# Patient Record
Sex: Female | Born: 1940
Health system: Southern US, Community
[De-identification: ages and names within clinical notes are randomized; demographics above are authoritative.]

## PROBLEM LIST (undated history)

## (undated) DIAGNOSIS — I10 Essential (primary) hypertension: Secondary | ICD-10-CM

## (undated) DIAGNOSIS — C4491 Basal cell carcinoma of skin, unspecified: Secondary | ICD-10-CM

## (undated) DIAGNOSIS — E785 Hyperlipidemia, unspecified: Secondary | ICD-10-CM

## (undated) DIAGNOSIS — F5101 Primary insomnia: Secondary | ICD-10-CM

## (undated) DIAGNOSIS — I4819 Other persistent atrial fibrillation: Secondary | ICD-10-CM

## (undated) DIAGNOSIS — F339 Major depressive disorder, recurrent, unspecified: Secondary | ICD-10-CM

## (undated) DIAGNOSIS — F1994 Other psychoactive substance use, unspecified with psychoactive substance-induced mood disorder: Secondary | ICD-10-CM

## (undated) DIAGNOSIS — I119 Hypertensive heart disease without heart failure: Secondary | ICD-10-CM

## (undated) DIAGNOSIS — F1020 Alcohol dependence, uncomplicated: Secondary | ICD-10-CM

## (undated) DIAGNOSIS — I4891 Unspecified atrial fibrillation: Secondary | ICD-10-CM

## (undated) DIAGNOSIS — I248 Other forms of acute ischemic heart disease: Secondary | ICD-10-CM

## (undated) DIAGNOSIS — D0510 Intraductal carcinoma in situ of unspecified breast: Secondary | ICD-10-CM

## (undated) HISTORY — DX: Hypertensive heart disease without heart failure: I11.9

## (undated) HISTORY — DX: Major depressive disorder, recurrent, unspecified: F33.9

## (undated) HISTORY — DX: Basal cell carcinoma of skin, unspecified: C44.91

## (undated) HISTORY — DX: Hyperlipidemia, unspecified: E78.5

## (undated) HISTORY — PX: MASTECTOMY: SHX3

## (undated) HISTORY — DX: Primary insomnia: F51.01

## (undated) HISTORY — PX: TONSILLECTOMY: SUR1361

## (undated) HISTORY — DX: Unspecified atrial fibrillation: I48.91

## (undated) HISTORY — DX: Other psychoactive substance use, unspecified with psychoactive substance-induced mood disorder: F19.94

## (undated) HISTORY — DX: Intraductal carcinoma in situ of unspecified breast: D05.10

## (undated) HISTORY — PX: APPENDECTOMY: SHX54

## (undated) HISTORY — DX: Other forms of acute ischemic heart disease: I24.8

## (undated) HISTORY — DX: Essential (primary) hypertension: I10

## (undated) HISTORY — PX: BREAST ENHANCEMENT SURGERY: SHX7

## (undated) HISTORY — DX: Other persistent atrial fibrillation: I48.19

## (undated) HISTORY — DX: Alcohol dependence, uncomplicated: F10.20

---

## 2015-09-13 DIAGNOSIS — E785 Hyperlipidemia, unspecified: Secondary | ICD-10-CM | POA: Insufficient documentation

## 2015-09-13 DIAGNOSIS — Z79899 Other long term (current) drug therapy: Secondary | ICD-10-CM | POA: Insufficient documentation

## 2015-09-13 DIAGNOSIS — F419 Anxiety disorder, unspecified: Secondary | ICD-10-CM

## 2015-09-13 DIAGNOSIS — G250 Essential tremor: Secondary | ICD-10-CM

## 2015-09-13 DIAGNOSIS — F339 Major depressive disorder, recurrent, unspecified: Secondary | ICD-10-CM | POA: Insufficient documentation

## 2015-09-13 DIAGNOSIS — I10 Essential (primary) hypertension: Secondary | ICD-10-CM | POA: Insufficient documentation

## 2015-09-13 DIAGNOSIS — R5381 Other malaise: Secondary | ICD-10-CM

## 2015-09-13 DIAGNOSIS — M1990 Unspecified osteoarthritis, unspecified site: Secondary | ICD-10-CM

## 2015-09-13 DIAGNOSIS — F5101 Primary insomnia: Secondary | ICD-10-CM

## 2015-09-13 DIAGNOSIS — R159 Full incontinence of feces: Secondary | ICD-10-CM

## 2015-09-13 HISTORY — DX: Other malaise: R53.81

## 2015-09-13 HISTORY — DX: Anxiety disorder, unspecified: F41.9

## 2015-09-13 HISTORY — DX: Primary insomnia: F51.01

## 2015-09-13 HISTORY — DX: Other long term (current) drug therapy: Z79.899

## 2015-09-13 HISTORY — DX: Major depressive disorder, recurrent, unspecified: F33.9

## 2015-09-13 HISTORY — DX: Hyperlipidemia, unspecified: E78.5

## 2015-09-13 HISTORY — DX: Essential (primary) hypertension: I10

## 2015-09-13 HISTORY — DX: Unspecified osteoarthritis, unspecified site: M19.90

## 2015-09-13 HISTORY — DX: Essential tremor: G25.0

## 2015-09-13 HISTORY — DX: Full incontinence of feces: R15.9

## 2015-11-20 DIAGNOSIS — E559 Vitamin D deficiency, unspecified: Secondary | ICD-10-CM

## 2015-11-20 HISTORY — DX: Vitamin D deficiency, unspecified: E55.9

## 2016-05-29 DIAGNOSIS — I10 Essential (primary) hypertension: Secondary | ICD-10-CM | POA: Diagnosis not present

## 2016-05-29 DIAGNOSIS — R45851 Suicidal ideations: Secondary | ICD-10-CM

## 2016-05-29 DIAGNOSIS — F10929 Alcohol use, unspecified with intoxication, unspecified: Secondary | ICD-10-CM

## 2016-05-29 DIAGNOSIS — F10239 Alcohol dependence with withdrawal, unspecified: Secondary | ICD-10-CM

## 2016-05-29 DIAGNOSIS — R748 Abnormal levels of other serum enzymes: Secondary | ICD-10-CM

## 2016-05-29 DIAGNOSIS — F329 Major depressive disorder, single episode, unspecified: Secondary | ICD-10-CM

## 2016-05-29 DIAGNOSIS — E785 Hyperlipidemia, unspecified: Secondary | ICD-10-CM

## 2016-05-30 DIAGNOSIS — I10 Essential (primary) hypertension: Secondary | ICD-10-CM | POA: Diagnosis not present

## 2016-05-30 DIAGNOSIS — F10929 Alcohol use, unspecified with intoxication, unspecified: Secondary | ICD-10-CM | POA: Diagnosis not present

## 2016-05-30 DIAGNOSIS — F10239 Alcohol dependence with withdrawal, unspecified: Secondary | ICD-10-CM | POA: Diagnosis not present

## 2016-05-30 DIAGNOSIS — R45851 Suicidal ideations: Secondary | ICD-10-CM | POA: Diagnosis not present

## 2016-05-31 DIAGNOSIS — R45851 Suicidal ideations: Secondary | ICD-10-CM | POA: Diagnosis not present

## 2016-05-31 DIAGNOSIS — F10929 Alcohol use, unspecified with intoxication, unspecified: Secondary | ICD-10-CM | POA: Diagnosis not present

## 2016-05-31 DIAGNOSIS — F10239 Alcohol dependence with withdrawal, unspecified: Secondary | ICD-10-CM | POA: Diagnosis not present

## 2016-05-31 DIAGNOSIS — I10 Essential (primary) hypertension: Secondary | ICD-10-CM | POA: Diagnosis not present

## 2016-06-01 DIAGNOSIS — F10239 Alcohol dependence with withdrawal, unspecified: Secondary | ICD-10-CM | POA: Diagnosis not present

## 2016-06-01 DIAGNOSIS — R45851 Suicidal ideations: Secondary | ICD-10-CM | POA: Diagnosis not present

## 2016-06-01 DIAGNOSIS — I10 Essential (primary) hypertension: Secondary | ICD-10-CM | POA: Diagnosis not present

## 2016-06-01 DIAGNOSIS — F10929 Alcohol use, unspecified with intoxication, unspecified: Secondary | ICD-10-CM | POA: Diagnosis not present

## 2016-06-02 DIAGNOSIS — F10239 Alcohol dependence with withdrawal, unspecified: Secondary | ICD-10-CM | POA: Diagnosis not present

## 2016-06-02 DIAGNOSIS — F10929 Alcohol use, unspecified with intoxication, unspecified: Secondary | ICD-10-CM | POA: Diagnosis not present

## 2016-06-02 DIAGNOSIS — I10 Essential (primary) hypertension: Secondary | ICD-10-CM | POA: Diagnosis not present

## 2016-06-02 DIAGNOSIS — R45851 Suicidal ideations: Secondary | ICD-10-CM | POA: Diagnosis not present

## 2016-06-11 DIAGNOSIS — F1020 Alcohol dependence, uncomplicated: Secondary | ICD-10-CM

## 2016-06-11 HISTORY — DX: Alcohol dependence, uncomplicated: F10.20

## 2016-07-25 DIAGNOSIS — F1994 Other psychoactive substance use, unspecified with psychoactive substance-induced mood disorder: Secondary | ICD-10-CM

## 2016-07-25 HISTORY — DX: Other psychoactive substance use, unspecified with psychoactive substance-induced mood disorder: F19.94

## 2016-08-10 DIAGNOSIS — F17200 Nicotine dependence, unspecified, uncomplicated: Secondary | ICD-10-CM | POA: Diagnosis not present

## 2016-08-10 DIAGNOSIS — R748 Abnormal levels of other serum enzymes: Secondary | ICD-10-CM

## 2016-08-10 DIAGNOSIS — J189 Pneumonia, unspecified organism: Secondary | ICD-10-CM

## 2016-08-10 DIAGNOSIS — F10239 Alcohol dependence with withdrawal, unspecified: Secondary | ICD-10-CM

## 2016-08-10 DIAGNOSIS — I10 Essential (primary) hypertension: Secondary | ICD-10-CM

## 2016-08-10 DIAGNOSIS — E871 Hypo-osmolality and hyponatremia: Secondary | ICD-10-CM

## 2016-08-10 DIAGNOSIS — F329 Major depressive disorder, single episode, unspecified: Secondary | ICD-10-CM

## 2016-08-11 DIAGNOSIS — R748 Abnormal levels of other serum enzymes: Secondary | ICD-10-CM | POA: Diagnosis not present

## 2016-08-11 DIAGNOSIS — R296 Repeated falls: Secondary | ICD-10-CM

## 2016-08-11 DIAGNOSIS — F10239 Alcohol dependence with withdrawal, unspecified: Secondary | ICD-10-CM | POA: Diagnosis not present

## 2016-08-11 DIAGNOSIS — E871 Hypo-osmolality and hyponatremia: Secondary | ICD-10-CM | POA: Diagnosis not present

## 2016-08-11 DIAGNOSIS — F17200 Nicotine dependence, unspecified, uncomplicated: Secondary | ICD-10-CM | POA: Diagnosis not present

## 2016-08-12 DIAGNOSIS — E871 Hypo-osmolality and hyponatremia: Secondary | ICD-10-CM | POA: Diagnosis not present

## 2016-08-12 DIAGNOSIS — F10239 Alcohol dependence with withdrawal, unspecified: Secondary | ICD-10-CM | POA: Diagnosis not present

## 2016-08-12 DIAGNOSIS — F17200 Nicotine dependence, unspecified, uncomplicated: Secondary | ICD-10-CM | POA: Diagnosis not present

## 2016-08-12 DIAGNOSIS — R748 Abnormal levels of other serum enzymes: Secondary | ICD-10-CM | POA: Diagnosis not present

## 2016-08-13 DIAGNOSIS — F17200 Nicotine dependence, unspecified, uncomplicated: Secondary | ICD-10-CM | POA: Diagnosis not present

## 2016-08-13 DIAGNOSIS — F10239 Alcohol dependence with withdrawal, unspecified: Secondary | ICD-10-CM | POA: Diagnosis not present

## 2016-08-13 DIAGNOSIS — R748 Abnormal levels of other serum enzymes: Secondary | ICD-10-CM | POA: Diagnosis not present

## 2016-08-13 DIAGNOSIS — E871 Hypo-osmolality and hyponatremia: Secondary | ICD-10-CM | POA: Diagnosis not present

## 2016-08-14 DIAGNOSIS — R748 Abnormal levels of other serum enzymes: Secondary | ICD-10-CM

## 2016-08-14 DIAGNOSIS — F10239 Alcohol dependence with withdrawal, unspecified: Secondary | ICD-10-CM

## 2016-08-14 DIAGNOSIS — I10 Essential (primary) hypertension: Secondary | ICD-10-CM

## 2016-08-14 DIAGNOSIS — E871 Hypo-osmolality and hyponatremia: Secondary | ICD-10-CM

## 2016-08-14 DIAGNOSIS — J189 Pneumonia, unspecified organism: Secondary | ICD-10-CM

## 2016-08-14 DIAGNOSIS — F329 Major depressive disorder, single episode, unspecified: Secondary | ICD-10-CM

## 2016-08-14 DIAGNOSIS — F17200 Nicotine dependence, unspecified, uncomplicated: Secondary | ICD-10-CM | POA: Diagnosis not present

## 2016-08-14 DIAGNOSIS — R296 Repeated falls: Secondary | ICD-10-CM

## 2016-08-15 DIAGNOSIS — E871 Hypo-osmolality and hyponatremia: Secondary | ICD-10-CM | POA: Diagnosis not present

## 2016-08-15 DIAGNOSIS — R748 Abnormal levels of other serum enzymes: Secondary | ICD-10-CM | POA: Diagnosis not present

## 2016-08-15 DIAGNOSIS — F17200 Nicotine dependence, unspecified, uncomplicated: Secondary | ICD-10-CM | POA: Diagnosis not present

## 2016-08-15 DIAGNOSIS — F10239 Alcohol dependence with withdrawal, unspecified: Secondary | ICD-10-CM | POA: Diagnosis not present

## 2016-08-16 DIAGNOSIS — E871 Hypo-osmolality and hyponatremia: Secondary | ICD-10-CM | POA: Diagnosis not present

## 2016-08-16 DIAGNOSIS — F10239 Alcohol dependence with withdrawal, unspecified: Secondary | ICD-10-CM | POA: Diagnosis not present

## 2016-08-16 DIAGNOSIS — R748 Abnormal levels of other serum enzymes: Secondary | ICD-10-CM | POA: Diagnosis not present

## 2016-08-16 DIAGNOSIS — F17200 Nicotine dependence, unspecified, uncomplicated: Secondary | ICD-10-CM | POA: Diagnosis not present

## 2016-10-20 DIAGNOSIS — I4819 Other persistent atrial fibrillation: Secondary | ICD-10-CM

## 2016-10-20 DIAGNOSIS — I119 Hypertensive heart disease without heart failure: Secondary | ICD-10-CM | POA: Insufficient documentation

## 2016-10-20 DIAGNOSIS — I248 Other forms of acute ischemic heart disease: Secondary | ICD-10-CM

## 2016-10-20 HISTORY — DX: Hypertensive heart disease without heart failure: I11.9

## 2016-10-20 HISTORY — DX: Other forms of acute ischemic heart disease: I24.8

## 2016-10-20 HISTORY — DX: Other persistent atrial fibrillation: I48.19

## 2018-05-21 NOTE — Progress Notes (Signed)
Cardiology Office Note:    Date:  05/22/2018   ID:  Rebekah Hunter, DOB Jan 21, 1941, MRN [id number]  PCP:  Lowella Dandy, NP  Cardiologist:  Shirlee More, MD    Referring MD: No ref. provider found    ASSESSMENT:    1. Chest pain in adult   2. Paroxysmal atrial fibrillation (HCC)   3. Hypertensive heart disease without heart failure   4. Alcoholism ([place])    PLAN:    In order of problems listed above:  1. Her symptoms are best described as atypical angina and I am quite concerned with a background history of previous demand ischemia.  Reviewed options for further evaluation either myocardial perfusion imaging or cardiac CTA she prefers not to leave town and she will be set up for cardiac CTA as an outpatient continue her current medications and not alarmed her with nitroglycerin that she can use in the future at the onset of symptoms.  If her myocardial perfusion study is normal I will ask her to have a CTA of her thoracic aorta although clinically she did not appear to have an acute aortic injury.  I will ask her to repeat a CBC with unexplained leukocytosis and recheck her d-dimer level today. 2. Stable maintain sinus rhythm 3. Continue her ARB 4. Stable   Next appointment: 4 weeks   Medication Adjustments/Labs and Tests Ordered: Current medicines are reviewed at length with the patient today.  Concerns regarding medicines are outlined above.  Orders Placed This Encounter  Procedures  . D-Dimer, Quantitative  . CBC  . MYOCARDIAL PERFUSION IMAGING  . EKG 12-Lead   Meds ordered this encounter  Medications  . nitroGLYCERIN (NITROSTAT) 0.4 MG SL tablet    Sig: Place 1 tablet (0.4 mg total) under the tongue every 5 (five) minutes as needed for chest pain.    Dispense:  25 tablet    Refill:  11    No chief complaint on file.   History of Present Illness:    SUELLYN Hunter is a 77 y.o. female with a hx of atrial fibrillation hypertension and demand ischemia in the  context of pneumonia and alcohol withdrawal syndrome last seen 01/23/17.  She is not anticoagulated her decision Compliance with diet, lifestyle and medications: Yes  [name] is seen today in follow-up to [facility] ED visit 05/07/2018 she presented to the hospital after several hours of chest pain at home told me she spent a total of 5-1/2 hours in the emergency room and unfortunately is quite displeased with her experience.  The onset of her chest pain was at rest she described as substernal tightness radiated through the chest both sides and up into her neck.  It was severe in nature but no diaphoresis nausea vomiting not pleuritic in nature and she was not short of breath.  She sat at home for hours hoping it would improve it did not when she finally got to the hospital she was given nitroglycerin with no relief and was also given Dilaudid which did not help her.  At the time she was discharged the symptoms had dissipated.  Her evaluation included 2 EKGs with minor nonspecific ST changes to normal troponins chest x-ray of atelectasis or opacity elevated white count 17,500 d-dimer was elevated but age-adjusted normal.  She did not have a CTA performed.  She was offered admission to the hospital declined and seeks my attention today.  She has had no recurrence.  She has not done well recently  she is undergoing repeated oral surgery for implants that have failed and feels under a great deal of personal and financial stress.  She has had no recurrent chest pain.  She has a background history of paroxysmal atrial fibrillation and had an elevated troponin felt to be due to demand ischemia in the setting of pneumonia and alcohol withdrawal syndrome.  She has had no palpitation syncope or TIA.  Past Medical History:  Diagnosis Date  . Alcoholism ([place]) 06/11/2016  . Demand ischemia ([place]) 10/20/2016  . Essential hypertension 09/13/2015   Last Assessment & Plan:  This is stable for her at this time and will  follow along  . Hyperlipidemia, unspecified 09/13/2015   Last Assessment & Plan:  Update her lipids for her fasting  . Hypertensive heart disease without heart failure 10/20/2016  . Persistent atrial fibrillation ([place]) 10/20/2016   No anticoag from fall risk.  CHADS2 vasc=4  . Primary insomnia 09/13/2015   Last Assessment & Plan:  She feels this is stable for her at this time and will follow  . Recurrent major depressive disorder ([place]) 09/13/2015   Last Assessment & Plan:  Psychiatrist has said we can follow her now and she is comfortable with this as well on her remeron and her ativan and will be taking this over in about month for refills for her she will let us know when she needs this  . Substance induced mood disorder ([place]) 07/25/2016    Past Surgical History:  Procedure Laterality Date  . APPENDECTOMY    . BREAST ENHANCEMENT SURGERY    . MASTECTOMY    . TONSILLECTOMY      Current Medications: Current Meds  Medication Sig  . acetaminophen (TYLENOL) 500 MG tablet Take 500 mg by mouth every 6 (six) hours as needed.  . busPIRone (BUSPAR) 5 MG tablet TAKE 1 TABLET BY MOUTH THREE TIMES A DAY AS NEEDED FOR ANXIETY  . cetirizine (ZYRTEC) 10 MG tablet Take 10 mg by mouth daily as needed for allergies.  . Cholecalciferol (VITAMIN D3) 3000 units TABS Take 1 tablet by mouth daily.  Marland Kitchen losartan (COZAAR) 50 MG tablet Take 1 tablet by mouth daily.  . mirtazapine (REMERON) 15 MG tablet Take 1 tablet by mouth daily.  Marland Kitchen omeprazole (PRILOSEC) 20 MG capsule Take 1 capsule by mouth daily.  . pravastatin (PRAVACHOL) 40 MG tablet Take 1 tablet by mouth daily.     Allergies:   Iodine; Aspirin; Metoprolol; Diltiazem; and Latex   Social History   Socioeconomic History  . Marital status: Divorced    Spouse name: Not on file  . Number of children: Not on file  . Years of education: Not on file  . Highest education level: Not on file  Occupational History  . Not on file  Social Needs  . Financial  resource strain: Not on file  . Food insecurity:    Worry: Not on file    Inability: Not on file  . Transportation needs:    Medical: Not on file    Non-medical: Not on file  Tobacco Use  . Smoking status: Current Every Day Smoker  . Smokeless tobacco: Never Used  Substance and Sexual Activity  . Alcohol use: Yes  . Drug use: Not Currently  . Sexual activity: Not on file  Lifestyle  . Physical activity:    Days per week: Not on file    Minutes per session: Not on file  . Stress: Not on file  Relationships  .  Social connections:    Talks on phone: Not on file    Gets together: Not on file    Attends religious service: Not on file    Active member of club or organization: Not on file    Attends meetings of clubs or organizations: Not on file    Relationship status: Not on file  Other Topics Concern  . Not on file  Social History Narrative  . Not on file     Family History: The patient's family history includes Cancer in her daughter, mother, and sister; Diabetes in her mother; Stroke in her father. ROS:   Please see the history of present illness.    All other systems reviewed and are negative.  EKGs/Labs/Other Studies Reviewed:    The following studies were reviewed today:  EKG:  EKG ordered today.  The ekg ordered today demonstrates sinus rhythm normal EKG  Recent Labs: Records reviewed [facility] CBC was normal except white count 17,500 d-dimer 775 normal age-adjusted to normal troponins BMP was normal proBNP was low No results found for requested labs within last 8760 hours.  Recent Lipid Panel No results found for: CHOL, TRIG, HDL, CHOLHDL, VLDL, LDLCALC, LDLDIRECT  Physical Exam:    VS:  BP (!) 156/84 (BP Location: Right Arm, Patient Position: Sitting, Cuff Size: Normal)   Pulse 75   Ht 5\' 5"  (1.651 m)   Wt 133 lb (60.3 kg)   SpO2 97%   BMI 22.13 kg/m     Wt Readings from Last 3 Encounters:  05/22/18 133 lb (60.3 kg)     GEN:  Well nourished,  well developed in no acute distress HEENT: Normal NECK: No JVD; No carotid bruits LYMPHATICS: No lymphadenopathy CARDIAC: RRR, no murmurs, rubs, gallops RESPIRATORY:  Clear to auscultation without rales, wheezing or rhonchi  ABDOMEN: Soft, non-tender, non-distended MUSCULOSKELETAL:  No edema; No deformity  SKIN: Warm and dry NEUROLOGIC:  Alert and oriented x 3 PSYCHIATRIC:  Normal affect    Signed, Shirlee More, MD  05/22/2018 11:58 AM    [place]

## 2018-05-22 ENCOUNTER — Encounter: Payer: Self-pay | Admitting: Cardiology

## 2018-05-22 ENCOUNTER — Ambulatory Visit (INDEPENDENT_AMBULATORY_CARE_PROVIDER_SITE_OTHER): Payer: Medicare Other | Admitting: Cardiology

## 2018-05-22 ENCOUNTER — Telehealth: Payer: Self-pay | Admitting: Cardiology

## 2018-05-22 VITALS — BP 156/84 | HR 75 | Ht 65.0 in | Wt 133.0 lb

## 2018-05-22 DIAGNOSIS — R079 Chest pain, unspecified: Secondary | ICD-10-CM

## 2018-05-22 DIAGNOSIS — I480 Paroxysmal atrial fibrillation: Secondary | ICD-10-CM

## 2018-05-22 DIAGNOSIS — F1020 Alcohol dependence, uncomplicated: Secondary | ICD-10-CM | POA: Diagnosis not present

## 2018-05-22 DIAGNOSIS — I119 Hypertensive heart disease without heart failure: Secondary | ICD-10-CM | POA: Diagnosis not present

## 2018-05-22 MED ORDER — NITROGLYCERIN 0.4 MG SL SUBL: 0.4000 mg | SUBLINGUAL_TABLET | SUBLINGUAL | 11 refills | Status: AC | PRN

## 2018-05-22 NOTE — Telephone Encounter (Signed)
Says she was supposed to have labs today but didn't(states no one told her to go to the lab)

## 2018-05-22 NOTE — Telephone Encounter (Signed)
Patient reports not stopping at the lab today after visit for lab work. Patient will come back on Monday 05/25/18 to have labs drawn.

## 2018-05-22 NOTE — Patient Instructions (Signed)
Medication Instructions:  Your physician has recommended you make the following change in your medication:   START: nitroglycerin 0.4mg  sublingual (under the tongue) as needed for chest pain  When having chest pain, stop what you are doing and sit down. Take 1 nitro, wait 5 minutes. Still having chest pain, take 1 nitro, wait 5 minutes. Still having chest pain, take 1 nitro, dial 911. Total of 3 nitro in 15 minutes.    Labwork: You will have lab work done today: D Dimer, CBC  Testing/Procedures: Your physician has requested that you have a lexiscan myoview. For further information please visit [email or web address]. Please follow instruction sheet, as given.    Follow-Up: Your physician recommends that you schedule a follow-up appointment in: 4 weeks    Nitroglycerin sublingual tablets What is this medicine? NITROGLYCERIN (nye troe GLI ser in) is a type of vasodilator. It relaxes blood vessels, increasing the blood and oxygen supply to your heart. This medicine is used to relieve chest pain caused by angina. It is also used to prevent chest pain before activities like climbing stairs, going outdoors in cold weather, or sexual activity. This medicine may be used for other purposes; ask your health care provider or pharmacist if you have questions. COMMON BRAND NAME(S): Nitroquick, Nitrostat, Nitrotab What should I tell my health care provider before I take this medicine? They need to know if you have any of these conditions: -anemia -head injury, recent stroke, or bleeding in the brain -liver disease -previous heart attack -an unusual or allergic reaction to nitroglycerin, other medicines, foods, dyes, or preservatives -pregnant or trying to get pregnant -breast-feeding How should I use this medicine? Take this medicine by mouth as needed. At the first sign of an angina attack (chest pain or tightness) place one tablet under your tongue. You can also take this medicine 5 to 10  minutes before an event likely to produce chest pain. Follow the directions on the prescription label. Let the tablet dissolve under the tongue. Do not swallow whole. Replace the dose if you accidentally swallow it. It will help if your mouth is not dry. Saliva around the tablet will help it to dissolve more quickly. Do not eat or drink, smoke or chew tobacco while a tablet is dissolving. If you are not better within 5 minutes after taking ONE dose of nitroglycerin, call 9-1-1 immediately to seek emergency medical care. Do not take more than 3 nitroglycerin tablets over 15 minutes. If you take this medicine often to relieve symptoms of angina, your doctor or health care professional may provide you with different instructions to manage your symptoms. If symptoms do not go away after following these instructions, it is important to call 9-1-1 immediately. Do not take more than 3 nitroglycerin tablets over 15 minutes. Talk to your pediatrician regarding the use of this medicine in children. Special care may be needed. Overdosage: If you think you have taken too much of this medicine contact a poison control center or emergency room at once. NOTE: This medicine is only for you. Do not share this medicine with others. What if I miss a dose? This does not apply. This medicine is only used as needed. What may interact with this medicine? Do not take this medicine with any of the following medications: -certain migraine medicines like ergotamine and dihydroergotamine (DHE) -medicines used to treat erectile dysfunction like sildenafil, tadalafil, and vardenafil -riociguat This medicine may also interact with the following medications: -alteplase -aspirin -heparin -medicines for high blood pressure -  medicines for mental depression -other medicines used to treat angina -phenothiazines like chlorpromazine, mesoridazine, prochlorperazine, thioridazine This list may not describe all possible interactions.  Give your health care provider a list of all the medicines, herbs, non-prescription drugs, or dietary supplements you use. Also tell them if you smoke, drink alcohol, or use illegal drugs. Some items may interact with your medicine. What should I watch for while using this medicine? Tell your doctor or health care professional if you feel your medicine is no longer working. Keep this medicine with you at all times. Sit or lie down when you take your medicine to prevent falling if you feel dizzy or faint after using it. Try to remain calm. This will help you to feel better faster. If you feel dizzy, take several deep breaths and lie down with your feet propped up, or bend forward with your head resting between your knees. You may get drowsy or dizzy. Do not drive, use machinery, or do anything that needs mental alertness until you know how this drug affects you. Do not stand or sit up quickly, especially if you are an older patient. This reduces the risk of dizzy or fainting spells. Alcohol can make you more drowsy and dizzy. Avoid alcoholic drinks. Do not treat yourself for coughs, colds, or pain while you are taking this medicine without asking your doctor or health care professional for advice. Some ingredients may increase your blood pressure. What side effects may I notice from receiving this medicine? Side effects that you should report to your doctor or health care professional as soon as possible: -blurred vision -dry mouth -skin rash -sweating -the feeling of extreme pressure in the head -unusually weak or tired Side effects that usually do not require medical attention (report to your doctor or health care professional if they continue or are bothersome): -flushing of the face or neck -headache -irregular heartbeat, palpitations -nausea, vomiting This list may not describe all possible side effects. Call your doctor for medical advice about side effects. You may report side effects to FDA  at 1-800-FDA-1088. Where should I keep my medicine? Keep out of the reach of children. Store at room temperature between 20 and 25 degrees C (68 and 77 degrees F). Store in [facility] [employer]. Protect from light and moisture. Keep tightly closed. Throw away any unused medicine after the expiration date. NOTE: This sheet is a summary. It may not cover all possible information. If you have questions about this medicine, talk to your doctor, pharmacist, or health care provider.  2018 Elsevier/Gold Standard (2013-07-01 17:57:36)    Any Other Special Instructions Will Be Listed Below (If Applicable).     If you need a refill on your cardiac medications before your next appointment, please call your pharmacy.

## 2018-05-26 LAB — CBC
HEMOGLOBIN: 13.6 g/dL (ref 11.1–15.9)
Hematocrit: 40.5 % (ref 34.0–46.6)
MCH: 28.6 pg (ref 26.6–33.0)
MCHC: 33.6 g/dL (ref 31.5–35.7)
MCV: 85 fL (ref 79–97)
Platelets: 239 10*3/uL (ref 150–450)
RBC: 4.76 x10E6/uL (ref 3.77–5.28)
RDW: 13.6 % (ref 12.3–15.4)
WBC: 8.3 10*3/uL (ref 3.4–10.8)

## 2018-05-26 LAB — D-DIMER, QUANTITATIVE (NOT AT ARMC): D-DIMER: 0.67 mg{FEU}/L — AB (ref 0.00–0.49)

## 2018-06-03 ENCOUNTER — Telehealth (HOSPITAL_COMMUNITY): Payer: Self-pay | Admitting: *Deleted

## 2018-06-03 NOTE — Telephone Encounter (Signed)
Patient given detailed instructions per Myocardial Perfusion Study Information Sheet for the test on 06/09/18. Patient notified to arrive 15 minutes early and that it is imperative to arrive on time for appointment to keep from having the test rescheduled.  If you need to cancel or reschedule your appointment, please call the office within 24 hours of your appointment. . Patient verbalized understanding. Kirstie Peri

## 2018-06-09 ENCOUNTER — Ambulatory Visit: Payer: Medicare Other | Admitting: Cardiology

## 2018-06-09 ENCOUNTER — Ambulatory Visit (INDEPENDENT_AMBULATORY_CARE_PROVIDER_SITE_OTHER): Payer: Medicare Other

## 2018-06-09 VITALS — Ht 65.0 in | Wt 133.0 lb

## 2018-06-09 DIAGNOSIS — R51 Headache: Secondary | ICD-10-CM

## 2018-06-09 DIAGNOSIS — I119 Hypertensive heart disease without heart failure: Secondary | ICD-10-CM

## 2018-06-09 DIAGNOSIS — R110 Nausea: Secondary | ICD-10-CM | POA: Diagnosis not present

## 2018-06-09 DIAGNOSIS — I480 Paroxysmal atrial fibrillation: Secondary | ICD-10-CM | POA: Diagnosis not present

## 2018-06-09 DIAGNOSIS — I4819 Other persistent atrial fibrillation: Secondary | ICD-10-CM

## 2018-06-09 DIAGNOSIS — R079 Chest pain, unspecified: Secondary | ICD-10-CM | POA: Diagnosis not present

## 2018-06-09 DIAGNOSIS — F1020 Alcohol dependence, uncomplicated: Secondary | ICD-10-CM

## 2018-06-09 DIAGNOSIS — I481 Persistent atrial fibrillation: Secondary | ICD-10-CM

## 2018-06-09 LAB — MYOCARDIAL PERFUSION IMAGING
CHL CUP NUCLEAR SRS: 0
CHL CUP RESTING HR STRESS: 64 {beats}/min
CSEPPHR: 99 {beats}/min
LV dias vol: 43 mL (ref 46–106)
LVSYSVOL: 18 mL
SDS: 7
SSS: 7
TID: 0.94

## 2018-06-09 MED ORDER — REGADENOSON 0.4 MG/5ML IV SOLN
0.4000 mg | Freq: Once | INTRAVENOUS | Status: AC
  Administered 2018-06-09: 0.4 mg via INTRAVENOUS

## 2018-06-09 MED ORDER — TECHNETIUM TC 99M TETROFOSMIN IV KIT
31.6000 | PACK | Freq: Once | INTRAVENOUS | Status: AC | PRN
  Administered 2018-06-09: 31.6 via INTRAVENOUS

## 2018-06-09 MED ORDER — TECHNETIUM TC 99M TETROFOSMIN IV KIT
10.2000 | PACK | Freq: Once | INTRAVENOUS | Status: AC | PRN
  Administered 2018-06-09: 10.2 via INTRAVENOUS

## 2018-06-09 MED ORDER — AMINOPHYLLINE 25 MG/ML IV SOLN
75.0000 mg | Freq: Once | INTRAVENOUS | Status: AC
  Administered 2018-06-09: 75 mg via INTRAVENOUS

## 2018-07-09 ENCOUNTER — Ambulatory Visit (INDEPENDENT_AMBULATORY_CARE_PROVIDER_SITE_OTHER): Payer: Medicare Other | Admitting: Cardiology

## 2018-07-09 ENCOUNTER — Encounter: Payer: Self-pay | Admitting: Cardiology

## 2018-07-09 VITALS — BP 158/74 | HR 76 | Ht 65.0 in | Wt 133.8 lb

## 2018-07-09 DIAGNOSIS — R079 Chest pain, unspecified: Secondary | ICD-10-CM

## 2018-07-09 DIAGNOSIS — I119 Hypertensive heart disease without heart failure: Secondary | ICD-10-CM | POA: Diagnosis not present

## 2018-07-09 DIAGNOSIS — I4819 Other persistent atrial fibrillation: Secondary | ICD-10-CM | POA: Diagnosis not present

## 2018-07-09 NOTE — Patient Instructions (Signed)

## 2018-07-09 NOTE — Progress Notes (Signed)
Cardiology Office Note:    Date:  07/09/2018   ID:  [name], DOB 02/07/1941, MRN [id number]  PCP:  Lowella Dandy, NP  Cardiologist:  Shirlee More, MD    Referring MD: Lowella Dandy, NP    ASSESSMENT:    1. Persistent atrial fibrillation   2. Hypertensive heart disease without heart failure   3. Chest pain in adult    PLAN:    In order of problems listed above:  1. Stable she is asymptomatic rate is controlled and again defers antiplatelet or anticoagulation therapy 2. Stable continue ARB 3. Stable she has had typical angina normal myocardial perfusion study no recurrence and she will take nitroglycerin as needed.   Next appointment: 6 months   Medication Adjustments/Labs and Tests Ordered: Current medicines are reviewed at length with the patient today.  Concerns regarding medicines are outlined above.  No orders of the defined types were placed in this encounter.  No orders of the defined types were placed in this encounter.   Chief Complaint  Patient presents with  . Follow-up    after testing    History of Present Illness:    Rebekah Hunter is a 77 y.o. female with a hx of atrial fibrillation hypertension and demand ischemia in the context of pneumonia and alcohol withdrawal syndrome  last seen 05/22/18 after [facility] admission for chest pain. A MPI done after was normal.  Study Highlights   The left ventricular ejection fraction is normal (55-65%).  Nuclear stress EF: 59%.  There was no ST segment deviation noted during stress.  The study is normal.  This is a low risk study   Compliance with diet, lifestyle and medications: Yes  She has had no recurrent chest pain pleased with the quality of her life unaware of atrial fibrillation without palpitation edema shortness of breath syncope or TIA she is aspirin allergic I offered clopidogrel declined and does not want anticoagulation Past Medical History:  Diagnosis Date  . Alcoholism ([place])  06/11/2016  . Demand ischemia ([place]) 10/20/2016  . Essential hypertension 09/13/2015   Last Assessment & Plan:  This is stable for her at this time and will follow along  . Hyperlipidemia, unspecified 09/13/2015   Last Assessment & Plan:  Update her lipids for her fasting  . Hypertensive heart disease without heart failure 10/20/2016  . Persistent atrial fibrillation 10/20/2016   No anticoag from fall risk.  CHADS2 vasc=4  . Primary insomnia 09/13/2015   Last Assessment & Plan:  She feels this is stable for her at this time and will follow  . Recurrent major depressive disorder ([place]) 09/13/2015   Last Assessment & Plan:  Psychiatrist has said we can follow her now and she is comfortable with this as well on her remeron and her ativan and will be taking this over in about month for refills for her she will let us know when she needs this  . Substance induced mood disorder ([place]) 07/25/2016    Past Surgical History:  Procedure Laterality Date  . APPENDECTOMY    . BREAST ENHANCEMENT SURGERY    . MASTECTOMY    . TONSILLECTOMY      Current Medications: Current Meds  Medication Sig  . acetaminophen (TYLENOL) 500 MG tablet Take 500 mg by mouth every 6 (six) hours as needed.  . busPIRone (BUSPAR) 5 MG tablet TAKE 1 TABLET BY MOUTH THREE TIMES A DAY AS NEEDED FOR ANXIETY  . cetirizine (ZYRTEC) 10 MG tablet Take  10 mg by mouth daily as needed for allergies.  . Cholecalciferol (VITAMIN D3) 3000 units TABS Take 1 tablet by mouth daily.  Marland Kitchen losartan (COZAAR) 50 MG tablet Take 1 tablet by mouth daily.  . mirtazapine (REMERON) 15 MG tablet Take 1 tablet by mouth daily.  . nitroGLYCERIN (NITROSTAT) 0.4 MG SL tablet Place 1 tablet (0.4 mg total) under the tongue every 5 (five) minutes as needed for chest pain.  Marland Kitchen omeprazole (PRILOSEC) 20 MG capsule Take 1 capsule by mouth daily.  . pravastatin (PRAVACHOL) 40 MG tablet Take 1 tablet by mouth daily.     Allergies:   Iodine; Aspirin; Clonidine derivatives;  Metoprolol; Diltiazem; and Latex   Social History   Socioeconomic History  . Marital status: Divorced    Spouse name: Not on file  . Number of children: Not on file  . Years of education: Not on file  . Highest education level: Not on file  Occupational History  . Not on file  Social Needs  . Financial resource strain: Not on file  . Food insecurity:    Worry: Not on file    Inability: Not on file  . Transportation needs:    Medical: Not on file    Non-medical: Not on file  Tobacco Use  . Smoking status: Current Every Day Smoker  . Smokeless tobacco: Never Used  Substance and Sexual Activity  . Alcohol use: Yes  . Drug use: Not Currently  . Sexual activity: Not on file  Lifestyle  . Physical activity:    Days per week: Not on file    Minutes per session: Not on file  . Stress: Not on file  Relationships  . Social connections:    Talks on phone: Not on file    Gets together: Not on file    Attends religious service: Not on file    Active member of club or organization: Not on file    Attends meetings of clubs or organizations: Not on file    Relationship status: Not on file  Other Topics Concern  . Not on file  Social History Narrative  . Not on file     Family History: The patient's family history includes Cancer in her daughter, mother, and sister; Diabetes in her mother; Stroke in her father. ROS:   Please see the history of present illness.    All other systems reviewed and are negative.  EKGs/Labs/Other Studies Reviewed:    The following studies were reviewed today:  Recent Labs: 05/25/2018: Hemoglobin 13.6; Platelets 239  Recent Lipid Panel No results found for: CHOL, TRIG, HDL, CHOLHDL, VLDL, LDLCALC, LDLDIRECT  Physical Exam:    VS:  BP (!) 158/74 (BP Location: Right Arm, Patient Position: Sitting, Cuff Size: Normal)   Pulse 76   Ht 5\' 5"  (1.651 m)   Wt 133 lb 12.8 oz (60.7 kg)   SpO2 95%   BMI 22.27 kg/m     Wt Readings from Last 3  Encounters:  07/09/18 133 lb 12.8 oz (60.7 kg)  06/09/18 133 lb (60.3 kg)  05/22/18 133 lb (60.3 kg)     GEN:  Well nourished, well developed in no acute distress HEENT: Normal NECK: No JVD; No carotid bruits LYMPHATICS: No lymphadenopathy CARDIAC: Irr Irr variable S1  RESPIRATORY:  Clear to auscultation without rales, wheezing or rhonchi  ABDOMEN: Soft, non-tender, non-distended MUSCULOSKELETAL:  No edema; No deformity  SKIN: Warm and dry NEUROLOGIC:  Alert and oriented x 3 PSYCHIATRIC:  Normal affect  Signed, Shirlee More, MD  07/09/2018 10:33 AM    [place]

## 2019-01-05 ENCOUNTER — Encounter: Payer: Self-pay | Admitting: Cardiology

## 2019-01-05 ENCOUNTER — Telehealth: Payer: Self-pay | Admitting: Cardiology

## 2019-01-05 ENCOUNTER — Telehealth (INDEPENDENT_AMBULATORY_CARE_PROVIDER_SITE_OTHER): Payer: Medicare Other | Admitting: Cardiology

## 2019-01-05 ENCOUNTER — Other Ambulatory Visit: Payer: Self-pay

## 2019-01-05 VITALS — BP 132/68 | HR 86 | Ht 65.0 in | Wt 124.0 lb

## 2019-01-05 DIAGNOSIS — I248 Other forms of acute ischemic heart disease: Secondary | ICD-10-CM

## 2019-01-05 DIAGNOSIS — I2489 Other forms of acute ischemic heart disease: Secondary | ICD-10-CM

## 2019-01-05 DIAGNOSIS — E782 Mixed hyperlipidemia: Secondary | ICD-10-CM

## 2019-01-05 DIAGNOSIS — I4819 Other persistent atrial fibrillation: Secondary | ICD-10-CM | POA: Diagnosis not present

## 2019-01-05 DIAGNOSIS — I119 Hypertensive heart disease without heart failure: Secondary | ICD-10-CM

## 2019-01-05 NOTE — Patient Instructions (Addendum)
Medication Instructions:  Your physician recommends that you continue on your current medications as directed. Please refer to the Current Medication list given to you today.  If you need a refill on your cardiac medications before your next appointment, please call your pharmacy.   Lab work: None  If you have labs (blood work) drawn today and your tests are completely normal, you will receive your results only by: Marland Kitchen MyChart Message (if you have MyChart) OR . A paper copy in the mail If you have any lab test that is abnormal or we need to change your treatment, we will call you to review the results.  Testing/Procedures: None  Follow-Up: At [facility], you and your health needs are our priority.  As part of our continuing mission to provide you with exceptional heart care, we have created designated Provider Care Teams.  These Care Teams include your primary Cardiologist (physician) and Advanced Practice Providers (APPs -  Physician Assistants and Nurse Practitioners) who all work together to provide you with the care you need, when you need it. You will need a follow up appointment in 6 months: Monday, 07/05/2019, at 11:00 am in [place] office.

## 2019-01-05 NOTE — Progress Notes (Signed)
Virtual Visit via Telephone Note   She does not have access for video conference  This visit type was conducted due to national recommendations for restrictions regarding the COVID-19 Pandemic (e.g. social distancing) in an effort to limit this patient's exposure and mitigate transmission in our community.  Due to her co-morbid illnesses, this patient is at least at moderate risk for complications without adequate follow up.  This format is felt to be most appropriate for this patient at this time.  The patient did not have access to video technology/had technical difficulties with video requiring transitioning to audio format only (telephone).  All issues noted in this document were discussed and addressed.  No physical exam could be performed with this format.  Please refer to the patient's chart for her  consent to telehealth for [facility].   Evaluation Performed:  Follow-up visit  Date:  01/05/2019   ID:  Rebekah Hunter, DOB [birth year], MRN [id number]  Patient Location: Home Provider Location: Home  PCP:  Lowella Dandy, NP  Cardiologist:  No primary care provider on file. Dr Bettina Gavia Electrophysiologist:  None   Chief Complaint:  Atrial fibrillation  History of Present Illness:    Rebekah Hunter is a 78 y.o. female with a hx of atrial fibrillation hypertension and demand ischemia in the context of pneumonia and alcohol withdrawal syndrome  last seen 07/09/18.  Anticoagulation was recommended and the patient made a decision not to accept.  She is practicing social isolation social distancing wears a mask outdoors gloves and good handwashing and sensation  She has had no palpitation edema shortness of breath chest pain or syncope.  The patient does not have symptoms concerning for COVID-19 infection (fever, chills, cough, or new shortness of breath).    Past Medical History:  Diagnosis Date  . Alcoholism ([place]) 06/11/2016  . Demand ischemia ([place]) 10/20/2016  . Essential  hypertension 09/13/2015   Last Assessment & Plan:  This is stable for her at this time and will follow along  . Hyperlipidemia, unspecified 09/13/2015   Last Assessment & Plan:  Update her lipids for her fasting  . Hypertensive heart disease without heart failure 10/20/2016  . Persistent atrial fibrillation 10/20/2016   No anticoag from fall risk.  CHADS2 vasc=4  . Primary insomnia 09/13/2015   Last Assessment & Plan:  She feels this is stable for her at this time and will follow  . Recurrent major depressive disorder ([place]) 09/13/2015   Last Assessment & Plan:  Psychiatrist has said we can follow her now and she is comfortable with this as well on her remeron and her ativan and will be taking this over in about month for refills for her she will let us know when she needs this  . Substance induced mood disorder ([place]) 07/25/2016   Past Surgical History:  Procedure Laterality Date  . APPENDECTOMY    . BREAST ENHANCEMENT SURGERY    . MASTECTOMY    . TONSILLECTOMY       Current Meds  Medication Sig  . acetaminophen (TYLENOL) 500 MG tablet Take 500 mg by mouth every 6 (six) hours as needed.  . busPIRone (BUSPAR) 5 MG tablet TAKE 1 TABLET BY MOUTH THREE TIMES A DAY AS NEEDED FOR ANXIETY  . cetirizine (ZYRTEC) 10 MG tablet Take 10 mg by mouth daily as needed for allergies.  . Cholecalciferol (VITAMIN D3) 3000 units TABS Take 1 tablet by mouth daily.  . diphenhydrAMINE (BENADRYL) 25 MG tablet Take 25  mg by mouth every 6 (six) hours as needed for allergies.  Marland Kitchen losartan (COZAAR) 50 MG tablet Take 1 tablet by mouth daily.  . mirtazapine (REMERON) 15 MG tablet Take 1 tablet by mouth daily.  . nitroGLYCERIN (NITROSTAT) 0.4 MG SL tablet Place 1 tablet (0.4 mg total) under the tongue every 5 (five) minutes as needed for chest pain.  Marland Kitchen omeprazole (PRILOSEC) 20 MG capsule Take 1 capsule by mouth daily as needed.   . pravastatin (PRAVACHOL) 40 MG tablet Take 1 tablet by mouth daily.     Allergies:    Iodine; Aspirin; Clonidine derivatives; Metoprolol; Diltiazem; and Latex   Social History   Tobacco Use  . Smoking status: Current Some Day Smoker    Types: Cigarettes  . Smokeless tobacco: Never Used  Substance Use Topics  . Alcohol use: Yes  . Drug use: Not Currently     Family Hx: The patient's family history includes Cancer in her daughter, mother, and sister; Diabetes in her mother; Stroke in her father.  ROS:   Please see the history of present illness.     All other systems reviewed and are negative.   Prior CV studies:   The following studies were reviewed today:  Study Highlights 06/09/18 Myoview  The left ventricular ejection fraction is normal (55-65%).  Nuclear stress EF: 59%.  There was no ST segment deviation noted during stress.  The study is normal.  This is a low risk study   Labs/Other Tests and Data Reviewed:    EKG:  No ECG reviewed.  Recent Labs:  08/24/18: CMP normal creatinine 0.76 LDL 111 HDL 56 Hemoglobin 14.0 platelets 206,000 05/25/2018: Hemoglobin 13.6; Platelets 239   Recent Lipid Panel No results found for: CHOL, TRIG, HDL, CHOLHDL, LDLCALC, LDLDIRECT  Wt Readings from Last 3 Encounters:  01/05/19 124 lb (56.2 kg)  07/09/18 133 lb 12.8 oz (60.7 kg)  06/09/18 133 lb (60.3 kg)     Objective:    Vital Signs:  BP 132/68 (BP Location: Right Arm, Patient Position: Sitting)   Pulse 86   Ht 5\' 5"  (1.651 m)   Wt 124 lb (56.2 kg)   BMI 20.63 kg/m    VITAL SIGNS:  reviewed PSYCH:  normal affect   ASSESSMENT & PLAN:    1. Atrial fibrillation, persistent rate controlled asymptomatic without suppressant medications and she has chosen not to be anticoagulated. 2. Hypertension stable continue her ARB BP at target continue to monitor at home 3. Demand ischemia no recurrence no evidence of significant CAD and myocardial perfusion study reviewed September 2019.  I do not think she requires a repeat ischemia evaluation at this time 4.  Hyperlipidemia stable continue her statin  COVID-19 Education: The signs and symptoms of COVID-19 were discussed with the patient and how to seek care for testing (follow up with PCP or arrange E-visit).  The importance of social distancing was discussed today.  Time:   Today, I have spent 22 minutes with the patient with telehealth technology discussing the above problems.     Medication Adjustments/Labs and Tests Ordered: Current medicines are reviewed at length with the patient today.  Concerns regarding medicines are outlined above.   Tests Ordered: No orders of the defined types were placed in this encounter.   Medication Changes: No orders of the defined types were placed in this encounter.   Disposition:  Follow up in 6 month(s)  Signed, Shirlee More, MD  01/05/2019 11:38 AM    [facility] 

## 2019-01-05 NOTE — Telephone Encounter (Signed)
Virtual Visit Pre-Appointment Phone Call  Steps For Call:  1. Confirm consent - "In the setting of the current Covid19 crisis, you are scheduled for a (phone or video) visit with your provider on (date) at (time).  Just as we do with many in-office visits, in order for you to participate in this visit, we must obtain consent.  If you'd like, I can send this to your mychart (if signed up) or email for you to review.  Otherwise, I can obtain your verbal consent now.  All virtual visits are billed to your insurance company just like a normal visit would be.  By agreeing to a virtual visit, we'd like you to understand that the technology does not allow for your provider to perform an examination, and thus may limit your provider's ability to fully assess your condition. If your provider identifies any concerns that need to be evaluated in person, we will make arrangements to do so.  Finally, though the technology is pretty good, we cannot assure that it will always work on either your or our end, and in the setting of a video visit, we may have to convert it to a phone-only visit.  In either situation, we cannot ensure that we have a secure connection.  Are you willing to proceed?" STAFF: Did the patient verbally acknowledge consent to telehealth visit? Document YES/NO here: yes  2. Confirm the BEST phone number to call the day of the visit by including in appointment notes  3. Give patient instructions for MyChart download to smartphone OR [email or web address] as below if video visit (depending on what platform provider is using)  4. Confirm that appointment type is correct in Epic appointment notes (VIDEO vs PHONE)  5. Advise patient to be prepared with their blood pressure, heart rate, weight, any heart rhythm information, their current medicines, and a piece of paper and pen handy for any instructions they may receive the day of their visit  6. Inform patient they will receive a phone call 15 minutes  prior to their appointment time (may be from unknown caller ID) so they should be prepared to answer    TELEPHONE CALL NOTE  Rebekah Hunter has been deemed a candidate for a follow-up tele-health visit to limit community exposure during the Covid-19 pandemic. I spoke with the patient via phone to ensure availability of phone/video source, confirm preferred email & phone number, and discuss instructions and expectations.  I reminded Rebekah Chris Sheen to be prepared with any vital sign and/or heart rhythm information that could potentially be obtained via home monitoring, at the time of her visit. I reminded [name] to expect a phone call prior to her visit.  Calla Kicks 01/05/2019 9:30 AM   INSTRUCTIONS FOR DOWNLOADING THE MYCHART APP TO SMARTPHONE  - The patient must first make sure to have activated MyChart and know their login information - If Apple, go to [employer] and type in MyChart in the search bar and download the app. If Android, ask patient to go to [employer] and type in [place] in the search bar and download the app. The app is free but as with any other app downloads, their phone may require them to verify saved payment information or Apple/Android password.  - The patient will need to then log into the app with their MyChart username and password, and select  as their healthcare provider to link the account. When it is time for your visit,  go to the MyChart app, find appointments, and click Begin Video Visit. Be sure to Select Allow for your device to access the Microphone and Camera for your visit. You will then be connected, and your provider will be with you shortly.  **If they have any issues connecting, or need assistance please contact MyChart service desk [phone])**  **If using a computer, in order to ensure the best quality for their visit they will need to use either of the following Internet Browsers: [employer],  or Google Chrome**  IF USING DOXIMITY or DOXY.ME - The patient will receive a link just prior to their visit by text.     FULL LENGTH CONSENT FOR TELE-HEALTH VISIT   I hereby voluntarily request, consent and authorize [facility] and its employed or contracted physicians, physician assistants, nurse practitioners or other licensed health care professionals (the Practitioner), to provide me with telemedicine health care services (the Services") as deemed necessary by the treating Practitioner. I acknowledge and consent to receive the Services by the Practitioner via telemedicine. I understand that the telemedicine visit will involve communicating with the Practitioner through live audiovisual communication technology and the disclosure of certain medical information by electronic transmission. I acknowledge that I have been given the opportunity to request an in-person assessment or other available alternative prior to the telemedicine visit and am voluntarily participating in the telemedicine visit.  I understand that I have the right to withhold or withdraw my consent to the use of telemedicine in the course of my care at any time, without affecting my right to future care or treatment, and that the Practitioner or I may terminate the telemedicine visit at any time. I understand that I have the right to inspect all information obtained and/or recorded in the course of the telemedicine visit and may receive copies of available information for a reasonable fee.  I understand that some of the potential risks of receiving the Services via telemedicine include:   Delay or interruption in medical evaluation due to technological equipment failure or disruption;  Information transmitted may not be sufficient (e.g. poor resolution of images) to allow for appropriate medical decision making by the Practitioner; and/or   In rare instances, security protocols could fail, causing a breach of personal health  information.  Furthermore, I acknowledge that it is my responsibility to provide information about my medical history, conditions and care that is complete and accurate to the best of my ability. I acknowledge that Practitioner's advice, recommendations, and/or decision may be based on factors not within their control, such as incomplete or inaccurate data provided by me or distortions of diagnostic images or specimens that may result from electronic transmissions. I understand that the practice of medicine is not an exact science and that Practitioner makes no warranties or guarantees regarding treatment outcomes. I acknowledge that I will receive a copy of this consent concurrently upon execution via email to the email address I last provided but may also request a printed copy by calling the office of [facility].    I understand that my insurance will be billed for this visit.   I have read or had this consent read to me.  I understand the contents of this consent, which adequately explains the benefits and risks of the Services being provided via telemedicine.   I have been provided ample opportunity to ask questions regarding this consent and the Services and have had my questions answered to my satisfaction.  I give my informed  consent for the services to be provided through the use of telemedicine in my medical care  By participating in this telemedicine visit I agree to the above.

## 2019-07-05 ENCOUNTER — Ambulatory Visit: Payer: Medicare Other | Admitting: Cardiology

## 2019-08-18 NOTE — Progress Notes (Signed)
Cardiology Office Note:    Date:  08/19/2019   ID:  Rebekah Hunter, DOB [birth year], MRN [id number]  PCP:  Rebekah Dandy, NP  Cardiologist:  Rebekah More, MD    Referring MD: Rebekah Dandy, NP    ASSESSMENT:    1. Paroxysmal atrial fibrillation (HCC)   2. Hypertensive heart disease without heart failure   3. Mixed hyperlipidemia    PLAN:    In order of problems listed above:  1. Stable no clinical recurrence if recurrent she will require an antiarrhythmic drug and anticoagulation which she has declined so far 2. Stable hypertension continue ARB no evidence of heart failure 3. Stable tolerates a low intensity statin lipids at target   Next appointment: 1 year or sooner if she has breakthrough episodes of angina with her previous demand ischemia or atrial fibrillation.  Note she has a prescription for nitroglycerin   Medication Adjustments/Labs and Tests Ordered: Current medicines are reviewed at length with the patient today.  Concerns regarding medicines are outlined above.  No orders of the defined types were placed in this encounter.  No orders of the defined types were placed in this encounter.   Chief Complaint  Patient presents with  . Atrial Fibrillation  . Follow-up    History of Present Illness:    Rebekah Hunter is a 78 y.o. female with a hx of  atrial fibrillation hypertension and demand ischemia in the context of pneumonia and alcohol withdrawal syndrome seen 05/22/18 after [facility] admission for chest pain. A MPI done after was normal.   Study Highlights   The left ventricular ejection fraction is normal (55-65%).  Nuclear stress EF: 59%.  There was no ST segment deviation noted during stress.  The study is normal.  This is a low risk study   She was last seen 07/09/2018. Compliance with diet, lifestyle and medications: Yes  She continues to struggle with depression but fortunately has had no chest pain recurrent atrial fibrillation.  No exercise  intolerance palpitation shortness of breath syncope or TIA.  Recent labs performed 04/26/2019 cholesterol 201 HDL 58 LDL 117 creatinine normal Past Medical History:  Diagnosis Date  . Alcoholism ([place]) 06/11/2016  . Demand ischemia ([place]) 10/20/2016  . Essential hypertension 09/13/2015   Last Assessment & Plan:  This is stable for her at this time and will follow along  . Hyperlipidemia, unspecified 09/13/2015   Last Assessment & Plan:  Update her lipids for her fasting  . Hypertensive heart disease without heart failure 10/20/2016  . Persistent atrial fibrillation ([place]) 10/20/2016   No anticoag from fall risk.  CHADS2 vasc=4  . Primary insomnia 09/13/2015   Last Assessment & Plan:  She feels this is stable for her at this time and will follow  . Recurrent major depressive disorder ([place]) 09/13/2015   Last Assessment & Plan:  Psychiatrist has said we can follow her now and she is comfortable with this as well on her remeron and her ativan and will be taking this over in about month for refills for her she will let us know when she needs this  . Substance induced mood disorder ([place]) 07/25/2016    Past Surgical History:  Procedure Laterality Date  . APPENDECTOMY    . BREAST ENHANCEMENT SURGERY    . MASTECTOMY    . TONSILLECTOMY      Current Medications: Current Meds  Medication Sig  . acetaminophen (TYLENOL) 500 MG tablet Take 500 mg by mouth every 6 (six)  hours as needed.  . busPIRone (BUSPAR) 5 MG tablet TAKE 1 TABLET BY MOUTH THREE TIMES A DAY AS NEEDED FOR ANXIETY  . cetirizine (ZYRTEC) 10 MG tablet Take 10 mg by mouth daily as needed for allergies.  . Cholecalciferol (VITAMIN D3) 3000 units TABS Take 1 tablet by mouth daily.  . diphenhydrAMINE (BENADRYL) 25 MG tablet Take 25 mg by mouth every 6 (six) hours as needed for allergies.  Marland Kitchen losartan (COZAAR) 50 MG tablet Take 1 tablet by mouth daily.  . mirtazapine (REMERON) 15 MG tablet Take 1 tablet by mouth daily.  . nitroGLYCERIN (NITROSTAT)  0.4 MG SL tablet Place 1 tablet (0.4 mg total) under the tongue every 5 (five) minutes as needed for chest pain.  Marland Kitchen omeprazole (PRILOSEC) 20 MG capsule Take 1 capsule by mouth daily as needed.   . pravastatin (PRAVACHOL) 40 MG tablet Take 1 tablet by mouth daily.  . TURMERIC PO Take 1 tablet by mouth daily.     Allergies:   Iodine, Aspirin, Clonidine derivatives, Metoprolol, Diltiazem, and Latex   Social History   Socioeconomic History  . Marital status: Divorced    Spouse name: Not on file  . Number of children: Not on file  . Years of education: Not on file  . Highest education level: Not on file  Occupational History  . Not on file  Social Needs  . Financial resource strain: Not on file  . Food insecurity    Worry: Not on file    Inability: Not on file  . Transportation needs    Medical: Not on file    Non-medical: Not on file  Tobacco Use  . Smoking status: Current Some Day Smoker    Types: Cigarettes  . Smokeless tobacco: Never Used  Substance and Sexual Activity  . Alcohol use: Not Currently  . Drug use: Not Currently  . Sexual activity: Not on file  Lifestyle  . Physical activity    Days per week: Not on file    Minutes per session: Not on file  . Stress: Not on file  Relationships  . Social Herbalist on phone: Not on file    Gets together: Not on file    Attends religious service: Not on file    Active member of club or organization: Not on file    Attends meetings of clubs or organizations: Not on file    Relationship status: Not on file  Other Topics Concern  . Not on file  Social History Narrative  . Not on file     Family History: The patient's family history includes Cancer in her daughter, mother, and sister; Diabetes in her mother; Stroke in her father. ROS:   Please see the history of present illness.    All other systems reviewed and are negative.  EKGs/Labs/Other Studies Reviewed:    The following studies were reviewed today:   EKG:  EKG ordered today and personally reviewed.  The ekg ordered today demonstrates sinus rhythm normal EKG    Physical Exam:    VS:  BP (!) 164/84 (BP Location: Right Arm, Patient Position: Sitting, Cuff Size: Normal)   Pulse 72   Ht 5\' 5"  (1.651 m)   Wt 128 lb 6.4 oz (58.2 kg)   SpO2 97%   BMI 21.37 kg/m     Wt Readings from Last 3 Encounters:  08/19/19 128 lb 6.4 oz (58.2 kg)  01/05/19 124 lb (56.2 kg)  07/09/18 133 lb 12.8 oz (  60.7 kg)     GEN: She looks much healthier no longer looks chronically ill and debilitated well nourished, well developed in no acute distress HEENT: Normal NECK: No JVD; No carotid bruits LYMPHATICS: No lymphadenopathy CARDIAC: RRR, no murmurs, rubs, gallops RESPIRATORY:  Clear to auscultation without rales, wheezing or rhonchi  ABDOMEN: Soft, non-tender, non-distended MUSCULOSKELETAL:  No edema; No deformity  SKIN: Warm and dry NEUROLOGIC:  Alert and oriented x 3 PSYCHIATRIC:  Normal affect    Signed, Rebekah More, MD  08/19/2019 11:18 AM    [place]

## 2019-08-19 ENCOUNTER — Encounter: Payer: Self-pay | Admitting: Cardiology

## 2019-08-19 ENCOUNTER — Other Ambulatory Visit: Payer: Self-pay

## 2019-08-19 ENCOUNTER — Ambulatory Visit (INDEPENDENT_AMBULATORY_CARE_PROVIDER_SITE_OTHER): Payer: Medicare Other | Admitting: Cardiology

## 2019-08-19 VITALS — BP 164/84 | HR 72 | Ht 65.0 in | Wt 128.4 lb

## 2019-08-19 DIAGNOSIS — I480 Paroxysmal atrial fibrillation: Secondary | ICD-10-CM

## 2019-08-19 DIAGNOSIS — I119 Hypertensive heart disease without heart failure: Secondary | ICD-10-CM | POA: Diagnosis not present

## 2019-08-19 DIAGNOSIS — E782 Mixed hyperlipidemia: Secondary | ICD-10-CM | POA: Diagnosis not present

## 2019-08-19 NOTE — Patient Instructions (Signed)
Medication Instructions:  Your physician recommends that you continue on your current medications as directed. Please refer to the Current Medication list given to you today.  *If you need a refill on your cardiac medications before your next appointment, please call your pharmacy*  Lab Work: None  If you have labs (blood work) drawn today and your tests are completely normal, you will receive your results only by: Marland Kitchen MyChart Message (if you have MyChart) OR . A paper copy in the mail If you have any lab test that is abnormal or we need to change your treatment, we will call you to review the results.  Testing/Procedures: You had an EKG today.   Follow-Up: At [facility], you and your health needs are our priority.  As part of our continuing mission to provide you with exceptional heart care, we have created designated Provider Care Teams.  These Care Teams include your primary Cardiologist (physician) and Advanced Practice Providers (APPs -  Physician Assistants and Nurse Practitioners) who all work together to provide you with the care you need, when you need it.  Your next appointment:   9 month(s)  The format for your next appointment:   In Person  Provider:   Shirlee More, MD

## 2019-12-03 DIAGNOSIS — M810 Age-related osteoporosis without current pathological fracture: Secondary | ICD-10-CM | POA: Diagnosis not present

## 2019-12-03 DIAGNOSIS — I10 Essential (primary) hypertension: Secondary | ICD-10-CM | POA: Diagnosis not present

## 2019-12-03 DIAGNOSIS — J449 Chronic obstructive pulmonary disease, unspecified: Secondary | ICD-10-CM | POA: Diagnosis not present

## 2019-12-03 DIAGNOSIS — I480 Paroxysmal atrial fibrillation: Secondary | ICD-10-CM | POA: Diagnosis not present

## 2019-12-03 DIAGNOSIS — E785 Hyperlipidemia, unspecified: Secondary | ICD-10-CM | POA: Diagnosis not present

## 2019-12-10 DIAGNOSIS — I10 Essential (primary) hypertension: Secondary | ICD-10-CM | POA: Diagnosis not present

## 2019-12-10 DIAGNOSIS — Z6820 Body mass index (BMI) 20.0-20.9, adult: Secondary | ICD-10-CM | POA: Diagnosis not present

## 2020-01-03 DIAGNOSIS — Z6820 Body mass index (BMI) 20.0-20.9, adult: Secondary | ICD-10-CM | POA: Diagnosis not present

## 2020-01-03 DIAGNOSIS — I10 Essential (primary) hypertension: Secondary | ICD-10-CM | POA: Diagnosis not present

## 2020-01-10 DIAGNOSIS — Z6820 Body mass index (BMI) 20.0-20.9, adult: Secondary | ICD-10-CM | POA: Diagnosis not present

## 2020-01-10 DIAGNOSIS — I10 Essential (primary) hypertension: Secondary | ICD-10-CM | POA: Diagnosis not present

## 2020-01-28 DIAGNOSIS — I10 Essential (primary) hypertension: Secondary | ICD-10-CM | POA: Diagnosis not present

## 2020-01-28 DIAGNOSIS — Z6820 Body mass index (BMI) 20.0-20.9, adult: Secondary | ICD-10-CM | POA: Diagnosis not present

## 2020-02-16 DIAGNOSIS — D225 Melanocytic nevi of trunk: Secondary | ICD-10-CM | POA: Diagnosis not present

## 2020-02-16 DIAGNOSIS — D2239 Melanocytic nevi of other parts of face: Secondary | ICD-10-CM | POA: Diagnosis not present

## 2020-02-16 DIAGNOSIS — D1801 Hemangioma of skin and subcutaneous tissue: Secondary | ICD-10-CM | POA: Diagnosis not present

## 2020-02-16 DIAGNOSIS — L578 Other skin changes due to chronic exposure to nonionizing radiation: Secondary | ICD-10-CM | POA: Diagnosis not present

## 2020-03-13 DIAGNOSIS — E785 Hyperlipidemia, unspecified: Secondary | ICD-10-CM | POA: Diagnosis not present

## 2020-03-13 DIAGNOSIS — R252 Cramp and spasm: Secondary | ICD-10-CM | POA: Diagnosis not present

## 2020-03-13 DIAGNOSIS — Z6820 Body mass index (BMI) 20.0-20.9, adult: Secondary | ICD-10-CM | POA: Diagnosis not present

## 2020-03-13 DIAGNOSIS — I10 Essential (primary) hypertension: Secondary | ICD-10-CM | POA: Diagnosis not present

## 2020-04-26 DIAGNOSIS — Z0000 Encounter for general adult medical examination without abnormal findings: Secondary | ICD-10-CM | POA: Diagnosis not present

## 2020-04-26 DIAGNOSIS — Z9181 History of falling: Secondary | ICD-10-CM | POA: Diagnosis not present

## 2020-04-26 DIAGNOSIS — E785 Hyperlipidemia, unspecified: Secondary | ICD-10-CM | POA: Diagnosis not present

## 2020-05-15 ENCOUNTER — Telehealth: Payer: Self-pay | Admitting: Cardiology

## 2020-05-15 NOTE — Telephone Encounter (Signed)
Rebekah Hunter is calling requesting her appointment scheduled for 05/17/20 be made virtual. Please advise.

## 2020-05-15 NOTE — Telephone Encounter (Signed)
Spoke to patient just now and she let me know that she has a way to take all of her vitals prior to her virtual visit and the visit was switched to virtual for her.

## 2020-05-17 ENCOUNTER — Telehealth (INDEPENDENT_AMBULATORY_CARE_PROVIDER_SITE_OTHER): Payer: Medicare Other | Admitting: Cardiology

## 2020-05-17 ENCOUNTER — Encounter: Payer: Self-pay | Admitting: Cardiology

## 2020-05-17 VITALS — BP 111/64 | HR 64 | Wt 120.0 lb

## 2020-05-17 DIAGNOSIS — I119 Hypertensive heart disease without heart failure: Secondary | ICD-10-CM | POA: Diagnosis not present

## 2020-05-17 DIAGNOSIS — I480 Paroxysmal atrial fibrillation: Secondary | ICD-10-CM

## 2020-05-17 DIAGNOSIS — Z20822 Contact with and (suspected) exposure to covid-19: Secondary | ICD-10-CM

## 2020-05-17 HISTORY — DX: Contact with and (suspected) exposure to covid-19: Z20.822

## 2020-05-17 NOTE — Patient Instructions (Signed)
Medication Instructions:  Your physician recommends that you continue on your current medications as directed. Please refer to the Current Medication list given to you today.  *If you need a refill on your cardiac medications before your next appointment, please call your pharmacy*   Lab Work: None If you have labs (blood work) drawn today and your tests are completely normal, you will receive your results only by:  [place] (if you have MyChart) OR  A paper copy in the mail If you have any lab test that is abnormal or we need to change your treatment, we will call you to review the results.   Testing/Procedures: None   Follow-Up: At [facility], you and your health needs are our priority.  As part of our continuing mission to provide you with exceptional heart care, we have created designated Provider Care Teams.  These Care Teams include your primary Cardiologist (physician) and Advanced Practice Providers (APPs -  Physician Assistants and Nurse Practitioners) who all work together to provide you with the care you need, when you need it.  We recommend signing up for the patient portal called "MyChart".  Sign up information is provided on this After Visit Summary.  MyChart is used to connect with patients for Virtual Visits (Telemedicine).  Patients are able to view lab/test results, encounter notes, upcoming appointments, etc.  Non-urgent messages can be sent to your provider as well.   To learn more about what you can do with MyChart, go to [email or web address].    Your next appointment:   Patient will call to schedule once COVID-19 has passed or if she feels that she needs an appointment sooner.   The format for your next appointment:   In Person  Provider:   Shirlee More, MD   Other Instructions

## 2020-05-17 NOTE — Progress Notes (Signed)
Virtual Visit via Telephone Note   This visit type was conducted due to national recommendations for restrictions regarding the COVID-19 Pandemic (e.g. social distancing) in an effort to limit this patient's exposure and mitigate transmission in our community.  Due to her co-morbid illnesses, this patient is at least at moderate risk for complications without adequate follow up.  This format is felt to be most appropriate for this patient at this time.  The patient did not have access to video technology/had technical difficulties with video requiring transitioning to audio format only (telephone).  All issues noted in this document were discussed and addressed.  No physical exam could be performed with this format.  Please refer to the patient's chart for her  consent to telehealth for [facility]. Date:  05/17/2020   ID:  Rebekah Hunter, DOB 11-14-1940, MRN [id number]  PCP:  Lowella Dandy, NP  Cardiologist:  Shirlee More, MD    Referring MD: Lowella Dandy, NP    ASSESSMENT:    1. Hypertensive heart disease without heart failure   2. Paroxysmal atrial fibrillation ([place])   3. Close exposure to COVID-19 virus    PLAN:    In order of problems listed above:  1. Stable she is presently taking combination amlodipine and losartan BP is at target and we discussed optimal ways to measure blood pressure not first in the morning after resting 5 to 10 minutes. 2. Stable no clinical recurrence 3. Hyperlipidemia is at target continue her low intensity statin 4. She had exposure Sunday to a vaccinated person with Covid she was wearing a mask they shared a pew at church she is self isolating for 14 days.  Time 15 minutes Next appointment: 6 months   Medication Adjustments/Labs and Tests Ordered: Current medicines are reviewed at length with the patient today.  Concerns regarding medicines are outlined above.  No orders of the defined types were placed in this encounter.  No orders of  the defined types were placed in this encounter.   Chief Complaint  Patient presents with  . Follow-up  . Atrial Fibrillation  . Hypertension    History of Present Illness:    Rebekah Hunter is a 79 y.o. female with a hx of   paroxysmal atrial fibrillation hypertension and demand ischemia in the context of pneumonia and alcohol withdrawal syndrome and  [facility] health admission for chest pain with a normal myocardial perfusion study September 2019. She was  last seen 08/18/2020. Compliance with diet, lifestyle and medications: yes  Phone visit because of exposure to Covid Sunday She had no video link or technology for home visit. She has an ambulatory blood pressure cuff to measure heart rate and blood pressure  No edema chest pain palpitation or syncope.  She gets variable blood pressure measurements but we talked about it and she will void first thing in the morning and be sure that she rests 5 to 10 minutes before checking her home blood pressure.  Those numbers are at target. Past Medical History:  Diagnosis Date  . Alcoholism ([place]) 06/11/2016  . Arthritis 09/13/2015   Last Assessment & Plan:  Formatting of this note might be different from the original. She feels this is stable for her and if she is more mobile then she feels better as a whole and she is trying to be more with this  . Demand ischemia ([place]) 10/20/2016  . Essential hypertension 09/13/2015   Last Assessment & Plan:  This is stable for her at this time and will follow along  . Hyperlipidemia, unspecified 09/13/2015   Last Assessment & Plan:  Update her lipids for her fasting  . Hypertensive heart disease without heart failure 10/20/2016  . Malaise and fatigue 09/13/2015   Last Assessment & Plan:  Formatting of this note might be different from the original. Update her labs for her she is getting better with getting out more and being active  . Persistent atrial fibrillation ([place]) 10/20/2016   No anticoag from fall  risk.  CHADS2 vasc=4  . Primary insomnia 09/13/2015   Last Assessment & Plan:  She feels this is stable for her at this time and will follow  . Recurrent major depressive disorder ([place]) 09/13/2015   Last Assessment & Plan:  Psychiatrist has said we can follow her now and she is comfortable with this as well on her remeron and her ativan and will be taking this over in about month for refills for her she will let us know when she needs this  . Substance induced mood disorder ([place]) 07/25/2016  . Vitamin D deficiency 11/20/2015   Last Assessment & Plan:  Formatting of this note might be different from the original. She takes this orally will follow    Past Surgical History:  Procedure Laterality Date  . APPENDECTOMY    . BREAST ENHANCEMENT SURGERY    . MASTECTOMY    . TONSILLECTOMY      Current Medications: Current Meds  Medication Sig  . acetaminophen (TYLENOL) 500 MG tablet Take 500 mg by mouth every 6 (six) hours as needed.  Marland Kitchen amLODipine (NORVASC) 5 MG tablet Take 5 mg by mouth daily.  . cetirizine (ZYRTEC) 10 MG tablet Take 10 mg by mouth daily.  . Cholecalciferol (VITAMIN D3) 3000 units TABS Take 1 tablet by mouth daily.  . diphenhydrAMINE (BENADRYL) 25 MG tablet Take 25 mg by mouth every 6 (six) hours as needed for allergies.  Marland Kitchen LORazepam (ATIVAN) 0.5 MG tablet Take 0.5 tablets by mouth every 12 (twelve) hours as needed.  Marland Kitchen losartan (COZAAR) 100 MG tablet Take 100 mg by mouth at bedtime.  . nitroGLYCERIN (NITROSTAT) 0.4 MG SL tablet Place 1 tablet (0.4 mg total) under the tongue every 5 (five) minutes as needed for chest pain.  . pravastatin (PRAVACHOL) 40 MG tablet Take 1 tablet by mouth daily.  . TURMERIC PO Take 1 tablet by mouth daily.  . [DISCONTINUED] losartan (COZAAR) 50 MG tablet Take 1 tablet by mouth daily.     Allergies:   Iodine, Aspirin, Clonidine derivatives, Metoprolol, Diltiazem, and Latex   Social History   Socioeconomic History  . Marital status: Divorced     Spouse name: Not on file  . Number of children: Not on file  . Years of education: Not on file  . Highest education level: Not on file  Occupational History  . Not on file  Tobacco Use  . Smoking status: Current Some Day Smoker    Types: Cigarettes  . Smokeless tobacco: Never Used  Vaping Use  . Vaping Use: Every day  Substance and Sexual Activity  . Alcohol use: Not Currently  . Drug use: Not Currently  . Sexual activity: Not on file  Other Topics Concern  . Not on file  Social History Narrative  . Not on file   Social Determinants of Health   Financial Resource Strain:   . Difficulty of Paying Living Expenses: Not on file  Food Insecurity:   .  Worried About Charity fundraiser in the Last Year: Not on file  . Ran Out of Food in the Last Year: Not on file  Transportation Needs:   . Lack of Transportation (Medical): Not on file  . Lack of Transportation (Non-Medical): Not on file  Physical Activity:   . Days of Exercise per Week: Not on file  . Minutes of Exercise per Session: Not on file  Stress:   . Feeling of Stress : Not on file  Social Connections:   . Frequency of Communication with Friends and Family: Not on file  . Frequency of Social Gatherings with Friends and Family: Not on file  . Attends Religious Services: Not on file  . Active Member of Clubs or Organizations: Not on file  . Attends Archivist Meetings: Not on file  . Marital Status: Not on file     Family History: The patient's family history includes Cancer in her daughter, mother, and sister; Diabetes in her mother; Stroke in her father. ROS:   Please see the history of present illness.    All other systems reviewed and are negative.  EKGs/Labs/Other Studies Reviewed:    The following studies were reviewed today:  Recent Labs: 03/13/2020: Cholesterol 189 LDL 104 HDL 67 triglycerides 104, lipids at target Creatinine normal 0.72  Physical Exam:    VS:  BP 111/64 Comment: Home @  12:00  Pulse 64   Wt 120 lb (54.4 kg)   SpO2 98%   BMI 19.97 kg/m     Wt Readings from Last 3 Encounters:  05/17/20 120 lb (54.4 kg)  08/19/19 128 lb 6.4 oz (58.2 kg)  01/05/19 124 lb (56.2 kg)   Vital signs reviewed mood affect normal no respiratory distress wheezing during conversation   Signed, Shirlee More, MD  05/17/2020 1:27 PM    [facility]

## 2020-06-01 DIAGNOSIS — H353131 Nonexudative age-related macular degeneration, bilateral, early dry stage: Secondary | ICD-10-CM | POA: Diagnosis not present

## 2020-06-01 DIAGNOSIS — Z961 Presence of intraocular lens: Secondary | ICD-10-CM | POA: Diagnosis not present

## 2020-06-15 DIAGNOSIS — E785 Hyperlipidemia, unspecified: Secondary | ICD-10-CM | POA: Diagnosis not present

## 2020-06-15 DIAGNOSIS — I10 Essential (primary) hypertension: Secondary | ICD-10-CM | POA: Diagnosis not present

## 2020-06-15 DIAGNOSIS — Z23 Encounter for immunization: Secondary | ICD-10-CM | POA: Diagnosis not present

## 2020-06-15 DIAGNOSIS — I480 Paroxysmal atrial fibrillation: Secondary | ICD-10-CM | POA: Diagnosis not present

## 2020-12-19 DIAGNOSIS — K219 Gastro-esophageal reflux disease without esophagitis: Secondary | ICD-10-CM | POA: Diagnosis not present

## 2020-12-19 DIAGNOSIS — J069 Acute upper respiratory infection, unspecified: Secondary | ICD-10-CM | POA: Diagnosis not present

## 2020-12-19 DIAGNOSIS — J309 Allergic rhinitis, unspecified: Secondary | ICD-10-CM | POA: Diagnosis not present

## 2021-02-13 DIAGNOSIS — J449 Chronic obstructive pulmonary disease, unspecified: Secondary | ICD-10-CM | POA: Diagnosis not present

## 2021-02-13 DIAGNOSIS — Z87898 Personal history of other specified conditions: Secondary | ICD-10-CM | POA: Diagnosis not present

## 2021-02-13 DIAGNOSIS — I480 Paroxysmal atrial fibrillation: Secondary | ICD-10-CM | POA: Diagnosis not present

## 2021-02-13 DIAGNOSIS — I10 Essential (primary) hypertension: Secondary | ICD-10-CM | POA: Diagnosis not present

## 2021-02-13 DIAGNOSIS — E785 Hyperlipidemia, unspecified: Secondary | ICD-10-CM | POA: Diagnosis not present

## 2021-02-13 DIAGNOSIS — Z681 Body mass index (BMI) 19 or less, adult: Secondary | ICD-10-CM | POA: Diagnosis not present

## 2021-02-19 DIAGNOSIS — D225 Melanocytic nevi of trunk: Secondary | ICD-10-CM | POA: Diagnosis not present

## 2021-02-19 DIAGNOSIS — D2239 Melanocytic nevi of other parts of face: Secondary | ICD-10-CM | POA: Diagnosis not present

## 2021-02-19 DIAGNOSIS — L814 Other melanin hyperpigmentation: Secondary | ICD-10-CM | POA: Diagnosis not present

## 2021-02-19 DIAGNOSIS — D1801 Hemangioma of skin and subcutaneous tissue: Secondary | ICD-10-CM | POA: Diagnosis not present

## 2021-02-19 DIAGNOSIS — L820 Inflamed seborrheic keratosis: Secondary | ICD-10-CM | POA: Diagnosis not present

## 2021-04-30 DIAGNOSIS — Z139 Encounter for screening, unspecified: Secondary | ICD-10-CM | POA: Diagnosis not present

## 2021-04-30 DIAGNOSIS — E785 Hyperlipidemia, unspecified: Secondary | ICD-10-CM | POA: Diagnosis not present

## 2021-04-30 DIAGNOSIS — Z0000 Encounter for general adult medical examination without abnormal findings: Secondary | ICD-10-CM | POA: Diagnosis not present

## 2021-04-30 DIAGNOSIS — Z9181 History of falling: Secondary | ICD-10-CM | POA: Diagnosis not present

## 2021-06-15 DIAGNOSIS — I480 Paroxysmal atrial fibrillation: Secondary | ICD-10-CM | POA: Diagnosis not present

## 2021-06-15 DIAGNOSIS — E785 Hyperlipidemia, unspecified: Secondary | ICD-10-CM | POA: Diagnosis not present

## 2021-06-15 DIAGNOSIS — Z6820 Body mass index (BMI) 20.0-20.9, adult: Secondary | ICD-10-CM | POA: Diagnosis not present

## 2021-06-15 DIAGNOSIS — J449 Chronic obstructive pulmonary disease, unspecified: Secondary | ICD-10-CM | POA: Diagnosis not present

## 2021-06-15 DIAGNOSIS — I10 Essential (primary) hypertension: Secondary | ICD-10-CM | POA: Diagnosis not present

## 2021-07-09 DIAGNOSIS — Z23 Encounter for immunization: Secondary | ICD-10-CM | POA: Diagnosis not present

## 2021-07-17 DIAGNOSIS — H353131 Nonexudative age-related macular degeneration, bilateral, early dry stage: Secondary | ICD-10-CM | POA: Diagnosis not present

## 2021-07-17 DIAGNOSIS — H26492 Other secondary cataract, left eye: Secondary | ICD-10-CM | POA: Diagnosis not present

## 2021-07-17 DIAGNOSIS — H52223 Regular astigmatism, bilateral: Secondary | ICD-10-CM | POA: Diagnosis not present

## 2021-07-17 DIAGNOSIS — H524 Presbyopia: Secondary | ICD-10-CM | POA: Diagnosis not present

## 2021-07-17 DIAGNOSIS — H26491 Other secondary cataract, right eye: Secondary | ICD-10-CM | POA: Diagnosis not present

## 2021-07-17 DIAGNOSIS — Z961 Presence of intraocular lens: Secondary | ICD-10-CM | POA: Diagnosis not present

## 2021-07-31 DIAGNOSIS — S0003XA Contusion of scalp, initial encounter: Secondary | ICD-10-CM | POA: Diagnosis not present

## 2021-07-31 DIAGNOSIS — S40022A Contusion of left upper arm, initial encounter: Secondary | ICD-10-CM | POA: Diagnosis not present

## 2021-07-31 DIAGNOSIS — R55 Syncope and collapse: Secondary | ICD-10-CM | POA: Diagnosis not present

## 2021-07-31 DIAGNOSIS — I10 Essential (primary) hypertension: Secondary | ICD-10-CM | POA: Diagnosis not present

## 2021-08-05 NOTE — Progress Notes (Signed)
Cardiology Office Note:    Date:  08/06/2021   ID:  Rebekah Hunter, DOB September 25, 1940, MRN [id number]  PCP:  Lowella Dandy, NP  Cardiologist:  Shirlee More, MD    Referring MD: Lowella Dandy, NP    ASSESSMENT:    1. Syncope and collapse   2. Hypertensive heart disease without heart failure   3. Paroxysmal atrial fibrillation (HCC)   4. Mixed hyperlipidemia    PLAN:    In order of problems listed above:  And has become obvious during the visit that she had a syncopal episode at home and significant trauma.  We will go ahead and place a 2-week log monitor she is at risk for bradycardia with sick sinus syndrome perhaps tacky bradycardia with previous atrial fibrillation.  She is not anticoagulated at her choice. Stable blood pressure is managed by her PCP she will continue current treatment calcium channel blocker ARB and has normal orthostatic shift and blood pressure Continue her statin especially with her previous episode of demand ischemia Having an MRI tomorrow as part of her evaluation?  Brain Plans to have a chest x-ray done looking for evidence of rib fracture or lung injury she declines   Next appointment: 1 month   Medication Adjustments/Labs and Tests Ordered: Current medicines are reviewed at length with the patient today.  Concerns regarding medicines are outlined above.  Orders Placed This Encounter  Procedures   LONG TERM MONITOR-LIVE TELEMETRY (3-14 DAYS)   EKG 12-Lead    No orders of the defined types were placed in this encounter.   Chief Complaint  Patient presents with   Follow-up    She had a recent syncopal episode with trauma   Atrial Fibrillation   Hypertension   History of Present Illness:    Rebekah Hunter is a 80 y.o. female with a hx of atrial fibrillation hypertensive heart disease and demand ischemia in the context of pneumonia and alcohol withdrawal syndrome last seen 08/19/2019.  She had a myocardial perfusion study at in September  2019 low risk normal function EF 59% and no ischemia..  Compliance with diet, lifestyle and medications: Yes  His visit is prompted by an episode that happened November 12.  She tells me she had a fall when you speak to her she briefly lost consciousness fell in her bathroom struck her left chest and said that she was unconscious momentarily she developed a laceration around the left eye periorbital ecchymosis and quite a bruise on her left upper extremity.  She has had bilateral breast reconstruction and the left breast has been tender since then she also has had some pleuritic chest pain and she has had fits of sneezing.  She did not have a chest x-ray performed for lung injury.  She is having an MRI done tomorrow and she is uncertain what was suspected to the brain and she is really had episodes where she feels her heart beating at nighttime.  This is her first syncopal episode.  She is at risk for bradycardia with sick sinus syndrome paroxysmal atrial fibrillation and this episode we will go ahead and place a live monitor for the next 2 weeks.  She is not having orthostatic lightheadedness.  Recent labs 06/01/2021: CBC normal hemoglobin 13.4 platelets 218,000 CMP normal creatinine 0.90 GFR 64 cc sodium 141 potassium 4.7 Past Medical History:  Diagnosis Date   Alcoholism ([place]) 06/11/2016   Arthritis 09/13/2015   Last Assessment & Plan:  Formatting of this note might  be different from the original. She feels this is stable for her and if she is more mobile then she feels better as a whole and she is trying to be more with this   Demand ischemia ([place]) 10/20/2016   Essential hypertension 09/13/2015   Last Assessment & Plan:  This is stable for her at this time and will follow along   Hyperlipidemia, unspecified 09/13/2015   Last Assessment & Plan:  Update her lipids for her fasting   Hypertensive heart disease without heart failure 10/20/2016   Malaise and fatigue 09/13/2015   Last Assessment & Plan:   Formatting of this note might be different from the original. Update her labs for her she is getting better with getting out more and being active   Persistent atrial fibrillation ([place]) 10/20/2016   No anticoag from fall risk.  CHADS2 vasc=4   Primary insomnia 09/13/2015   Last Assessment & Plan:  She feels this is stable for her at this time and will follow   Recurrent major depressive disorder ([place]) 09/13/2015   Last Assessment & Plan:  Psychiatrist has said we can follow her now and she is comfortable with this as well on her remeron and her ativan and will be taking this over in about month for refills for her she will let us know when she needs this   Substance induced mood disorder ([place]) 07/25/2016   Vitamin D deficiency 11/20/2015   Last Assessment & Plan:  Formatting of this note might be different from the original. She takes this orally will follow    Past Surgical History:  Procedure Laterality Date   APPENDECTOMY     BREAST ENHANCEMENT SURGERY     MASTECTOMY     TONSILLECTOMY      Current Medications: Current Meds  Medication Sig   acetaminophen (TYLENOL) 500 MG tablet Take 500 mg by mouth every 6 (six) hours as needed for mild pain.   amLODipine (NORVASC) 5 MG tablet Take 5 mg by mouth daily. Takes 0.5 tablet (2.5 mg) daily   cetirizine (ZYRTEC) 10 MG tablet Take 10 mg by mouth daily.   Cholecalciferol (VITAMIN D3) 3000 units TABS Take 1 tablet by mouth daily.   diphenhydrAMINE (BENADRYL) 25 MG tablet Take 25 mg by mouth every 6 (six) hours as needed for allergies.   LORazepam (ATIVAN) 0.5 MG tablet Take 0.5 tablets by mouth every 12 (twelve) hours as needed for anxiety.   losartan (COZAAR) 100 MG tablet Take 100 mg by mouth at bedtime.   mirtazapine (REMERON) 15 MG tablet Take 1 tablet by mouth daily.   nitroGLYCERIN (NITROSTAT) 0.4 MG SL tablet Place 1 tablet (0.4 mg total) under the tongue every 5 (five) minutes as needed for chest pain.   omeprazole (PRILOSEC) 20 MG  capsule Take 1 capsule by mouth daily as needed (acid reflux).   pravastatin (PRAVACHOL) 40 MG tablet Take 1 tablet by mouth daily.   TURMERIC PO Take 1 tablet by mouth daily.     Allergies:   Iodine, Aspirin, Clonidine derivatives, Metoprolol, Diltiazem, and Latex   Social History   Socioeconomic History   Marital status: Divorced    Spouse name: Not on file   Number of children: Not on file   Years of education: Not on file   Highest education level: Not on file  Occupational History   Not on file  Tobacco Use   Smoking status: Some Days    Types: Cigarettes   Smokeless tobacco: Never  Vaping Use   Vaping Use: Every day  Substance and Sexual Activity   Alcohol use: Not Currently   Drug use: Not Currently   Sexual activity: Not on file  Other Topics Concern   Not on file  Social History Narrative   Not on file   Social Determinants of Health   Financial Resource Strain: Not on file  Food Insecurity: Not on file  Transportation Needs: Not on file  Physical Activity: Not on file  Stress: Not on file  Social Connections: Not on file     Family History: The patient's family history includes Cancer in her daughter, mother, and sister; Diabetes in her mother; Stroke in her father. ROS:   Please see the history of present illness.    All other systems reviewed and are negative.  EKGs/Labs/Other Studies Reviewed:    The following studies were reviewed today:  EKG:  EKG ordered today and personally reviewed.  The ekg ordered today demonstrates sinus rhythm nonspecific T waves otherwise normal EKG  Recent Labs: 06/15/2021 cholesterol 184 LDL 112 HDL 61 triglycerides 116 hemoglobin 13.1 creatinine 0.9 potassium 4.7  Physical Exam:    VS:  Ht 5\' 5"  (1.651 m)   Wt 123 lb (55.8 kg)   SpO2 97%   BMI 20.47 kg/m     Wt Readings from Last 3 Encounters:  08/06/21 123 lb (55.8 kg)  05/17/20 120 lb (54.4 kg)  08/19/19 128 lb 6.4 oz (58.2 kg)     GEN: Appears her age  well nourished, well developed in no acute distress HEENT: Normal NECK: No JVD; No carotid bruits LYMPHATICS: No lymphadenopathy CARDIAC: RRR, no murmurs, rubs, gallops RESPIRATORY:  Clear to auscultation without rales, wheezing or rhonchi  ABDOMEN: Soft, non-tender, non-distended MUSCULOSKELETAL:  No edema; No deformity  SKIN: Warm and dry NEUROLOGIC:  Alert and oriented x 3 PSYCHIATRIC:  Normal affect    Signed, Shirlee More, MD  08/06/2021 11:00 AM    [facility]

## 2021-08-06 ENCOUNTER — Telehealth: Payer: Self-pay | Admitting: Cardiology

## 2021-08-06 ENCOUNTER — Encounter: Payer: Self-pay | Admitting: Cardiology

## 2021-08-06 ENCOUNTER — Other Ambulatory Visit: Payer: Self-pay

## 2021-08-06 ENCOUNTER — Ambulatory Visit: Payer: Medicare Other | Admitting: Cardiology

## 2021-08-06 ENCOUNTER — Ambulatory Visit (INDEPENDENT_AMBULATORY_CARE_PROVIDER_SITE_OTHER): Payer: Medicare Other

## 2021-08-06 VITALS — Ht 65.0 in | Wt 123.0 lb

## 2021-08-06 DIAGNOSIS — I119 Hypertensive heart disease without heart failure: Secondary | ICD-10-CM | POA: Diagnosis not present

## 2021-08-06 DIAGNOSIS — I480 Paroxysmal atrial fibrillation: Secondary | ICD-10-CM | POA: Diagnosis not present

## 2021-08-06 DIAGNOSIS — R55 Syncope and collapse: Secondary | ICD-10-CM | POA: Diagnosis not present

## 2021-08-06 DIAGNOSIS — E782 Mixed hyperlipidemia: Secondary | ICD-10-CM | POA: Diagnosis not present

## 2021-08-06 NOTE — Patient Instructions (Signed)
Medication Instructions:  Your physician recommends that you continue on your current medications as directed. Please refer to the Current Medication list given to you today.  *If you need a refill on your cardiac medications before your next appointment, please call your pharmacy*   Lab Work: None If you have labs (blood work) drawn today and your tests are completely normal, you will receive your results only by: [place] (if you have MyChart) OR A paper copy in the mail If you have any lab test that is abnormal or we need to change your treatment, we will call you to review the results.   Testing/Procedures: A zio monitor was ordered today. It will remain on for 14 days. You will then return monitor and event diary in provided box. It takes 1-2 weeks for report to be downloaded and returned to [place]. We will call you with the results. If monitor falls off or has orange flashing light, please call Zio for further instructions.     Follow-Up: At [facility], you and your health needs are our priority.  As part of our continuing mission to provide you with exceptional heart care, we have created designated Provider Care Teams.  These Care Teams include your primary Cardiologist (physician) and Advanced Practice Providers (APPs -  Physician Assistants and Nurse Practitioners) who all work together to provide you with the care you need, when you need it.  We recommend signing up for the patient portal called "MyChart".  Sign up information is provided on this After Visit Summary.  MyChart is used to connect with patients for Virtual Visits (Telemedicine).  Patients are able to view lab/test results, encounter notes, upcoming appointments, etc.  Non-urgent messages can be sent to your provider as well.   To learn more about what you can do with MyChart, go to [email or web address].    Your next appointment:   1 month(s)  The format for your next appointment:   In  Person  Provider:   Shirlee More, MD    Other Instructions

## 2021-08-06 NOTE — Telephone Encounter (Signed)
Spoke to the patient just now. I told her that per Dr. Bettina Gavia she can take this off to get her MRI done tomorrow. He would like for her to put another one on or try to get the one she currently has back on. The patient is very rude on the phone with me. She tells me that she is unsure if she is willing to do that but "She will see". She would not let me make her an appointment to get another monitor on after the scheduled MRI tomorrow as she just kept saying that she would see what she decided to do.   I advised for her to call back if she needed further assistance from [place] and she verbalizes understanding.

## 2021-08-06 NOTE — Telephone Encounter (Signed)
Patient states she was told she could have her MRI while she had her monitor on, but then they said that she can't. She states she has to have her monitor on for a month and would like to know if she still needs to have the MRI, since it will be so long. She would like to make sure Dr. Bettina Gavia know this as well as the tech who told her she could have the MRI with the monitor on. She says she needs a call back as soon as possible.

## 2021-08-07 ENCOUNTER — Telehealth: Payer: Self-pay | Admitting: Cardiology

## 2021-08-07 DIAGNOSIS — R55 Syncope and collapse: Secondary | ICD-10-CM | POA: Diagnosis not present

## 2021-08-07 NOTE — Telephone Encounter (Signed)
Spoke with [facility] just now. They let me know that the patient was in A-fib for 90 seconds earlier today. Heart beat ranged from 80-132 bpm but the average was 100 bpm. They called the patient and she reported feeling anxious but this was the only symptom she was having. She was driving at the time the call was placed.   Message sent to Dr. Bettina Gavia to make him aware.

## 2021-08-07 NOTE — Telephone Encounter (Signed)
[facility] calling with abnormal EKG REF # [id number]

## 2021-08-14 ENCOUNTER — Emergency Department (HOSPITAL_COMMUNITY)
Admission: EM | Admit: 2021-08-14 | Discharge: 2021-08-14 | Disposition: A | Discharge status: Home / Self Care | Payer: Medicare Other | Attending: Emergency Medicine | Admitting: Emergency Medicine

## 2021-08-14 ENCOUNTER — Emergency Department (HOSPITAL_COMMUNITY): Payer: Medicare Other

## 2021-08-14 ENCOUNTER — Encounter (HOSPITAL_COMMUNITY): Payer: Self-pay

## 2021-08-14 ENCOUNTER — Other Ambulatory Visit: Payer: Self-pay

## 2021-08-14 DIAGNOSIS — R112 Nausea with vomiting, unspecified: Secondary | ICD-10-CM | POA: Diagnosis not present

## 2021-08-14 DIAGNOSIS — G319 Degenerative disease of nervous system, unspecified: Secondary | ICD-10-CM | POA: Diagnosis not present

## 2021-08-14 DIAGNOSIS — S0990XA Unspecified injury of head, initial encounter: Secondary | ICD-10-CM | POA: Diagnosis not present

## 2021-08-14 DIAGNOSIS — R0789 Other chest pain: Secondary | ICD-10-CM | POA: Diagnosis not present

## 2021-08-14 DIAGNOSIS — R9082 White matter disease, unspecified: Secondary | ICD-10-CM | POA: Diagnosis not present

## 2021-08-14 DIAGNOSIS — F17210 Nicotine dependence, cigarettes, uncomplicated: Secondary | ICD-10-CM | POA: Diagnosis not present

## 2021-08-14 DIAGNOSIS — R6889 Other general symptoms and signs: Secondary | ICD-10-CM | POA: Diagnosis not present

## 2021-08-14 DIAGNOSIS — R350 Frequency of micturition: Secondary | ICD-10-CM | POA: Insufficient documentation

## 2021-08-14 DIAGNOSIS — Y92009 Unspecified place in unspecified non-institutional (private) residence as the place of occurrence of the external cause: Secondary | ICD-10-CM | POA: Insufficient documentation

## 2021-08-14 DIAGNOSIS — R110 Nausea: Secondary | ICD-10-CM | POA: Diagnosis not present

## 2021-08-14 DIAGNOSIS — Z91040 Latex allergy status: Secondary | ICD-10-CM | POA: Insufficient documentation

## 2021-08-14 DIAGNOSIS — Z79899 Other long term (current) drug therapy: Secondary | ICD-10-CM | POA: Diagnosis not present

## 2021-08-14 DIAGNOSIS — S299XXA Unspecified injury of thorax, initial encounter: Secondary | ICD-10-CM | POA: Diagnosis not present

## 2021-08-14 DIAGNOSIS — R55 Syncope and collapse: Secondary | ICD-10-CM | POA: Diagnosis not present

## 2021-08-14 DIAGNOSIS — W1839XA Other fall on same level, initial encounter: Secondary | ICD-10-CM | POA: Insufficient documentation

## 2021-08-14 DIAGNOSIS — Y9301 Activity, walking, marching and hiking: Secondary | ICD-10-CM | POA: Diagnosis not present

## 2021-08-14 DIAGNOSIS — Z743 Need for continuous supervision: Secondary | ICD-10-CM | POA: Diagnosis not present

## 2021-08-14 DIAGNOSIS — I119 Hypertensive heart disease without heart failure: Secondary | ICD-10-CM | POA: Insufficient documentation

## 2021-08-14 DIAGNOSIS — R0902 Hypoxemia: Secondary | ICD-10-CM | POA: Diagnosis not present

## 2021-08-14 LAB — URINALYSIS, ROUTINE W REFLEX MICROSCOPIC
Bilirubin Urine: NEGATIVE
Glucose, UA: NEGATIVE mg/dL
Hgb urine dipstick: NEGATIVE
Ketones, ur: NEGATIVE mg/dL
Leukocytes,Ua: NEGATIVE
Nitrite: NEGATIVE
Protein, ur: NEGATIVE mg/dL
Specific Gravity, Urine: 1.025 (ref 1.005–1.030)
pH: 5.5 (ref 5.0–8.0)

## 2021-08-14 LAB — BASIC METABOLIC PANEL
Anion gap: 9 (ref 5–15)
BUN: 7 mg/dL — ABNORMAL LOW (ref 8–23)
CO2: 24 mmol/L (ref 22–32)
Calcium: 9.2 mg/dL (ref 8.9–10.3)
Chloride: 104 mmol/L (ref 98–111)
Creatinine, Ser: 0.82 mg/dL (ref 0.44–1.00)
GFR, Estimated: >60 mL/min (ref >60)
Glucose, Bld: 122 mg/dL — ABNORMAL HIGH (ref 70–99)
Potassium: 4 mmol/L (ref 3.5–5.1)
Sodium: 137 mmol/L (ref 135–145)

## 2021-08-14 LAB — CBC
HCT: 38.7 % (ref 36.0–46.0)
Hemoglobin: 12.6 g/dL (ref 12.0–15.0)
MCH: 29.4 pg (ref 26.0–34.0)
MCHC: 32.6 g/dL (ref 30.0–36.0)
MCV: 90.2 fL (ref 80.0–100.0)
Platelets: 199 10*3/uL (ref 150–400)
RBC: 4.29 MIL/uL (ref 3.87–5.11)
RDW: 13 % (ref 11.5–15.5)
WBC: 15.8 10*3/uL — ABNORMAL HIGH (ref 4.0–10.5)
nRBC: 0 % (ref 0.0–0.2)

## 2021-08-14 MED ORDER — LORAZEPAM 0.5 MG PO TABS: 1.0000 mg | ORAL_TABLET | Freq: Two times a day (BID) | ORAL | 0 refills | Status: DC | PRN

## 2021-08-14 MED ORDER — ONDANSETRON 4 MG PO TBDP
4.0000 mg | ORAL_TABLET | Freq: Once | ORAL | Status: AC
  Administered 2021-08-14: 4 mg via ORAL

## 2021-08-14 MED ORDER — LIDOCAINE 5 % EX PTCH: 1.0000 | MEDICATED_PATCH | CUTANEOUS | 0 refills | Status: DC

## 2021-08-14 MED ORDER — SODIUM CHLORIDE 0.9 % IV BOLUS
500.0000 mL | Freq: Once | INTRAVENOUS | Status: AC
  Administered 2021-08-14: 500 mL via INTRAVENOUS

## 2021-08-14 NOTE — ED Triage Notes (Signed)
Pt BIB [employer] n/v that has persisted since she had a fall 8 days ago, & has progressively worsened. She endorses lac to the Lt side of head & that her PCP put her on a cardiac monitor to see if A-fib may have been the etiology to her fall (per EMS). She was given 4 mg Zofran & 500 mL NS in a Rt 20g PIV, Restricted on left side d/t breast Ca.

## 2021-08-14 NOTE — ED Notes (Signed)
Patient transported to CT 

## 2021-08-14 NOTE — ED Notes (Signed)
Patient transported to X-ray 

## 2021-08-14 NOTE — ED Provider Notes (Signed)
3:43 PM Care assumed from Dr. Jeanell Sparrow.  At time of transfer care, patient is awaiting for results of CT head, labs, urinalysis to evaluate for cause of fall/syncope 3 weeks ago and imaging to look for traumatic injuries.  Plan of care per previous team is to discharge home if work-up is reassuring.  7:53 PM Work-up finally returned.  Patient does have leukocytosis but otherwise work-up is reassuring.  Urinalysis does not show convincing evidence of UTI and both chest x-ray and CT head are reassuring.  Suspect soft tissue injury and likely a postconcussive type syndrome.  Patient also says that her anxiety and stress is likely making her symptoms worse and she says that she is now out of Ativan that she normally takes.  She is requesting a short prescription for some Ativan and she will see her PCP/cardiologist and will also give prescription for Lidoderm patches.  Patient agrees.  We briefly discussed getting CT imaging to look at the soft tissues as she initially felt that her left breast implant had shifted but she would rather get the medications and follow-up with the PCP.  Prescription was printed and patient will be discharged per previous plan and reassessment with patient.  Clinical Impression: 1. Nausea   2. Chest wall pain     Disposition: Discharge  Condition: Good  I have discussed the results, Dx and Tx plan with the pt(& family if present). He/she/they expressed understanding and agree(s) with the plan. Discharge instructions discussed at great length. Strict return precautions discussed and pt &/or family have verbalized understanding of the instructions. No further questions at time of discharge.    New Prescriptions   LIDOCAINE (LIDODERM) 5 %    Place 1 patch onto the skin daily. Remove & Discard patch within 12 hours or as directed by MD   LORAZEPAM (ATIVAN) 0.5 MG TABLET    Take 2 tablets (1 mg total) by mouth 2 (two) times daily as needed for anxiety.    Follow Up: Lowella Dandy, NP [place] [address] [phone]     your cardiologist          Miracle Criado, Gwenyth Allegra, MD 08/14/21 1958

## 2021-08-14 NOTE — ED Notes (Signed)
Discharge instructions, follow up care, and prescription reviewed with patient. Time for questions allowed - pt denies any further needs at this time. Per social worker she is going to call patient an Melburn Popper and escort her out

## 2021-08-14 NOTE — ED Notes (Signed)
MD Tegeler speaking with social worker about getting patient a ride home

## 2021-08-14 NOTE — ED Provider Notes (Signed)
[facility] EMERGENCY DEPARTMENT Provider Note   CSN: [id number] Arrival date & time: 08/14/21  1315     History Chief Complaint  Patient presents with   Nausea   Emesis    Rebekah Hunter is a 80 y.o. female.  HPI 80 year old female presents today complaining of nausea.  She states that she has had some pain in her left chest wall since she had a episode of passing out on November 9.  She states at that time she was in her usual state of health.  She is walking in her house and woke up on the ground.  She does not think it was very long, but there was no witness and she has no definite way of knowing.  She was able to get up on her own.  She saw her primary care doctor several days later.  She states that time she had pain in her left anterior chest wall.  She feels that where she had breast cancer, may have been injured in the fall.  She has had ongoing pain with palpation in that area she was seen by her cardiologist and had a monitor in place.  She is not been informed that there was any abnormalities on these tracings.  Today she has nausea but otherwise no new complaints.  Denies any headache, neck pain, back pain, numbness, tingling, weakness, lower extremity injuries or pain.  She states that she has has frequency of urination but this is not changed from prior.  She reports eating and drinking as usual.  She denies any fever chills cough     Past Medical History:  Diagnosis Date   Alcoholism ([place]) 06/11/2016   Arthritis 09/13/2015   Last Assessment & Plan:  Formatting of this note might be different from the original. She feels this is stable for her and if she is more mobile then she feels better as a whole and she is trying to be more with this   Demand ischemia ([place]) 10/20/2016   Essential hypertension 09/13/2015   Last Assessment & Plan:  This is stable for her at this time and will follow along   Hyperlipidemia, unspecified 09/13/2015   Last Assessment  & Plan:  Update her lipids for her fasting   Hypertensive heart disease without heart failure 10/20/2016   Malaise and fatigue 09/13/2015   Last Assessment & Plan:  Formatting of this note might be different from the original. Update her labs for her she is getting better with getting out more and being active   Persistent atrial fibrillation ([place]) 10/20/2016   No anticoag from fall risk.  CHADS2 vasc=4   Primary insomnia 09/13/2015   Last Assessment & Plan:  She feels this is stable for her at this time and will follow   Recurrent major depressive disorder ([place]) 09/13/2015   Last Assessment & Plan:  Psychiatrist has said we can follow her now and she is comfortable with this as well on her remeron and her ativan and will be taking this over in about month for refills for her she will let us know when she needs this   Substance induced mood disorder ([place]) 07/25/2016   Vitamin D deficiency 11/20/2015   Last Assessment & Plan:  Formatting of this note might be different from the original. She takes this orally will follow    Patient Active Problem List   Diagnosis Date Noted   Close exposure to COVID-19 virus 05/17/2020   Demand ischemia ([place])  10/20/2016   Hypertensive heart disease without heart failure 10/20/2016   Persistent atrial fibrillation ([place]) 10/20/2016   Substance induced mood disorder ([place]) 07/25/2016   Alcoholism ([place]) 06/11/2016   Vitamin D deficiency 11/20/2015   Anxiety 09/13/2015   Essential tremor 09/13/2015   High risk medications (not anticoagulants) long-term use 09/13/2015   Hyperlipidemia, unspecified 09/13/2015   Primary insomnia 09/13/2015   Recurrent major depressive disorder ([place]) 09/13/2015   Essential hypertension 09/13/2015   Arthritis 09/13/2015   Fecal incontinence 09/13/2015   Malaise and fatigue 09/13/2015    Past Surgical History:  Procedure Laterality Date   APPENDECTOMY     BREAST ENHANCEMENT SURGERY     MASTECTOMY     TONSILLECTOMY       OB  History   No obstetric history on file.     Family History  Problem Relation Age of Onset   Cancer Mother    Diabetes Mother    Cancer Sister    Cancer Daughter    Stroke Father     Social History   Tobacco Use   Smoking status: Some Days    Types: Cigarettes   Smokeless tobacco: Never  Vaping Use   Vaping Use: Every day  Substance Use Topics   Alcohol use: Not Currently   Drug use: Not Currently    Home Medications Prior to Admission medications   Medication Sig Start Date End Date Taking? Authorizing Provider  acetaminophen (TYLENOL) 500 MG tablet Take 500 mg by mouth every 6 (six) hours as needed for mild pain.    [provider]  amLODipine (NORVASC) 5 MG tablet Take 5 mg by mouth daily. Takes 0.5 tablet (2.5 mg) daily 04/27/20   [provider]  cetirizine (ZYRTEC) 10 MG tablet Take 10 mg by mouth daily.    [provider]  Cholecalciferol (VITAMIN D3) 3000 units TABS Take 1 tablet by mouth daily.    [provider]  diphenhydrAMINE (BENADRYL) 25 MG tablet Take 25 mg by mouth every 6 (six) hours as needed for allergies.    [provider]  LORazepam (ATIVAN) 0.5 MG tablet Take 0.5 tablets by mouth every 12 (twelve) hours as needed for anxiety. 03/14/20   [provider]  losartan (COZAAR) 100 MG tablet Take 100 mg by mouth at bedtime. 03/27/20   [provider]  mirtazapine (REMERON) 15 MG tablet Take 1 tablet by mouth daily. 04/17/16 08/06/21  [provider]  nitroGLYCERIN (NITROSTAT) 0.4 MG SL tablet Place 1 tablet (0.4 mg total) under the tongue every 5 (five) minutes as needed for chest pain. 05/22/18   Richardo Priest, MD  omeprazole (PRILOSEC) 20 MG capsule Take 1 capsule by mouth daily as needed (acid reflux). 02/13/18   [provider]  pravastatin (PRAVACHOL) 40 MG tablet Take 1 tablet by mouth daily. 05/22/16   [provider]  TURMERIC PO Take 1 tablet by mouth daily.    [provider]    Allergies    Iodine, Aspirin, Clonidine derivatives, Metoprolol, Diltiazem, and Latex  Review of Systems   Review of Systems  All other systems reviewed and are negative.  Physical Exam Updated Vital Signs SpO2 95%   Physical Exam Vitals and nursing note reviewed.  Constitutional:      Appearance: Normal appearance.  HENT:     Head: Normocephalic and atraumatic.     Right Ear: External ear normal.     Left Ear: External ear normal.     Mouth/Throat:  Pharynx: Oropharynx is clear.  Eyes:     Extraocular Movements: Extraocular movements intact.     Pupils: Pupils are equal, round, and reactive to light.  Cardiovascular:     Rate and Rhythm: Normal rate.     Pulses: Normal pulses.     Heart sounds: Normal heart sounds.  Pulmonary:     Effort: Pulmonary effort is normal.     Breath sounds: Normal breath sounds.     Comments: Left anterior chest wall tenderness Abdominal:     Palpations: Abdomen is soft.  Musculoskeletal:     Cervical back: Normal range of motion and neck supple. No tenderness.    ED Results / Procedures / Treatments   Labs (all labs ordered are listed, but only abnormal results are displayed) Labs Reviewed  CBC  BASIC METABOLIC PANEL  URINALYSIS, ROUTINE W REFLEX MICROSCOPIC    EKG None  Radiology DG Ribs Unilateral W/Chest Left  Result Date: 08/14/2021 CLINICAL DATA:  Fall, pain in left anterior chest EXAM: LEFT RIBS AND CHEST - 3+ VIEW COMPARISON:  Chest radiograph 05/07/2018 FINDINGS: The heart is at the upper limits of normal for size. The upper mediastinal contours are stable. A left chest wall cardiac device is noted. There is no focal consolidation or pulmonary edema. There is no pleural effusion or pneumothorax. There is a remote fracture of the left third rib, unchanged. No acute displaced rib fracture is seen. IMPRESSION: No displaced rib fracture or other acute abnormality identified in the chest. Electronically  Signed   By: Valetta Mole M.D.   On: 08/14/2021 15:07    Procedures Procedures   Medications Ordered in ED Medications  ondansetron (ZOFRAN-ODT) disintegrating tablet 4 mg (has no administration in time range)  sodium chloride 0.9 % bolus 500 mL (500 mLs Intravenous New Bag/Given 08/14/21 1513)    ED Course  I have reviewed the triage vital signs and the nursing notes.  Pertinent labs & imaging results that were available during my care of the patient were reviewed by me and considered in my medical decision making (see chart for details).    MDM Rules/Calculators/A&P                         80 year old female presents today complaining of nausea after syncopal episode 3 weeks ago.  She has had ongoing complaints of left-sided chest wall pain.  Patient has not been evaluated in the ED with labs or imaging since the fall.  She had seen her primary care doctor and was referred to cardiology.  Review of records from office feel that patient did have an episode of A. fib while she was being monitored.  I did not find any other reported episodes of arrhythmia. Awaiting vital signs, EKG, and lab work-up. Discussed care with Dr. Sherry Ruffing and he will follow-up and disposition appropriately. Final Clinical Impression(s) / ED Diagnoses Final diagnoses:  Nausea    Rx / DC Orders ED Discharge Orders     None        Pattricia Boss, MD 08/14/21 1551

## 2021-08-14 NOTE — Discharge Instructions (Signed)
Your history, exam and work-up today were overall reassuring with no evidence of acute fracture or pneumothorax.  Please use the Lidoderm patches to help with the focal chest wall pain and for your anxiety, please use the medications we discussed.  Please be careful not to have another syncopal episode and follow-up with both your PCP and your cardiologist.  If any symptoms change or worsen, please return to the nearest emergency department.

## 2021-08-20 DIAGNOSIS — R55 Syncope and collapse: Secondary | ICD-10-CM

## 2021-08-29 ENCOUNTER — Telehealth: Payer: Self-pay

## 2021-08-29 NOTE — Telephone Encounter (Signed)
-----   Message from Richardo Priest, MD sent at 08/29/2021  8:28 AM EST ----- She is having frequent atrial fibrillation about half of the time  Heart rates are rapid often greater than 110 bpm  I would like her to start taking a low-dose beta-blocker Toprol-XL 25 mg daily  She needs to have a blood pressure cuff at home I would like her to trend her blood pressures twice a day and in 2 to 3 weeks either send a list through [place] or drop off at the office  She has decided previously not to accept an anticoagulant.  Atrial fibrillation is not new.

## 2021-08-29 NOTE — Telephone Encounter (Signed)
Spoke with patient regarding results and recommendation.  Patient verbalizes understanding but states she can not take metoprolol. She states she will discuss this with Dr. Bettina Gavia when she comes for her appointment on the 20th.    Encouraged patient to call back with any questions or concerns.

## 2021-09-01 NOTE — Progress Notes (Signed)
Cardiology Office Note:    Date:  09/04/2021   ID:  Rebekah Hunter, DOB 1941/05/10, MRN [id number]  PCP:  Lowella Dandy, NP  Cardiologist:  Shirlee More, MD    Referring MD: Lowella Dandy, NP    ASSESSMENT:    1. Paroxysmal atrial fibrillation (HCC)   2. Hypertensive heart disease without heart failure   3. Mixed hyperlipidemia    PLAN:    In order of problems listed above:  She has more atrial fibrillation than I thought her A. fib burden is about 50% at this time asymptomatic does not want to take suppressive treatment this make his decision not to be anticoagulated.  I gave her information about watchman procedure that she does not be anticoagulated before and after. Stable hypertension she gave me a list of blood pressures I reviewed them she averages 130 140/70-80. Continue her statin We discussed her expectations I apologized to her if she did fall and I did not listen to her last visit, I also told her I do not practice general medicine and issues of urinalysis urine culture and follow-up with labs from the emergency room would be through her primary care physician and gave her a copy of the UA so she could handcarried to facilitate her care.   Next appointment: 6 months   Medication Adjustments/Labs and Tests Ordered: Current medicines are reviewed at length with the patient today.  Concerns regarding medicines are outlined above.  No orders of the defined types were placed in this encounter.  No orders of the defined types were placed in this encounter.  Follow-up atrial fibrillation she wore a recent monitor that shows almost 50% burden with rapid daytime rates.  She is at risk for tachybradycardia syndrome History of Present Illness:    Rebekah Hunter is a 80 y.o. female with a hx of paroxysmal atrial fibrillation hypertensive heart disease with previous demand ischemia in the context of pneumonia and alcohol withdrawal syndrome last seen 07/28/2021 following a  syncopal episode.  Compliance with diet, lifestyle and medications: Yes  She had a 14-day event monitor reported 08/29/2021 which showed atrial fibrillation 49% of the time with rapid daytime heart rate and no bradycardia.  She begins by telling me she is disappointed in my care She did not think I listened to her the last time she came to the office about the events of the fall I stopped and allowed her to talk and she described them to me. She was given the impression at the emergency room when she was seen that I should have done a COVID test on her and I told her this is something outside the scope of my practice that I do not do. She had a urinalysis done and was told there will be a culture and was given the impression that I be the person responsible for transmitting the information to her.  We stopped wearing We looked at the ED records and she never had a culture performed.  To facilitate her care I copied the report of her urinalysis she can have it.  Her primary care office. She has a high burden of atrial fibrillation has made a decision not to accept anticoagulation We discussed watchman device but she have to be anticoagulated before and after.  She said she will consider and I gave her information. We discussed her rapid heart rate with atrial fibrillation she is asymptomatic in the past she did not tolerate metoprolol or rate limiting calcium  channel blockers and prefers not to be on suppressive treatment at this time. If she had recurrent fall/syncope I would advise her to have implanted loop recorder for tachybradycardia syndrome. Past Medical History:  Diagnosis Date   Alcoholism ([place]) 06/11/2016   Anxiety 09/13/2015   Last Assessment & Plan:  She is doing better with this as a whole she feels continue with her current dose of med   Arthritis 09/13/2015   Last Assessment & Plan:  Formatting of this note might be different from the original. She feels this is stable for her and  if she is more mobile then she feels better as a whole and she is trying to be more with this   Close exposure to COVID-19 virus 05/17/2020   Demand ischemia ([place]) 10/20/2016   Essential hypertension 09/13/2015   Last Assessment & Plan:  This is stable for her at this time and will follow along   Essential tremor 09/13/2015   Last Assessment & Plan:  This is stable for her and will follow   Fecal incontinence 09/13/2015   Last Assessment & Plan:  Formatting of this note might be different from the original. This is improved for her with eating more fruits and vegetables and avoiding beef and rich red meds   High risk medications (not anticoagulants) long-term use 09/13/2015   Hyperlipidemia, unspecified 09/13/2015   Last Assessment & Plan:  Update her lipids for her fasting   Hypertensive heart disease without heart failure 10/20/2016   Malaise and fatigue 09/13/2015   Last Assessment & Plan:  Formatting of this note might be different from the original. Update her labs for her she is getting better with getting out more and being active   Persistent atrial fibrillation ([place]) 10/20/2016   No anticoag from fall risk.  CHADS2 vasc=4   Primary insomnia 09/13/2015   Last Assessment & Plan:  She feels this is stable for her at this time and will follow   Recurrent major depressive disorder ([place]) 09/13/2015   Last Assessment & Plan:  Psychiatrist has said we can follow her now and she is comfortable with this as well on her remeron and her ativan and will be taking this over in about month for refills for her she will let us know when she needs this   Substance induced mood disorder ([place]) 07/25/2016   Vitamin D deficiency 11/20/2015   Last Assessment & Plan:  Formatting of this note might be different from the original. She takes this orally will follow    Past Surgical History:  Procedure Laterality Date   APPENDECTOMY     BREAST ENHANCEMENT SURGERY     MASTECTOMY     TONSILLECTOMY      Current  Medications: Current Meds  Medication Sig   acetaminophen (TYLENOL) 500 MG tablet Take 500 mg by mouth every 6 (six) hours as needed for mild pain.   amLODipine (NORVASC) 5 MG tablet Take 5 mg by mouth daily. Takes 0.5 tablet (2.5 mg) daily   cetirizine (ZYRTEC) 10 MG tablet Take 10 mg by mouth daily.   Cholecalciferol (VITAMIN D3) 3000 units TABS Take 1 tablet by mouth daily.   diphenhydrAMINE (BENADRYL) 25 MG tablet Take 25 mg by mouth every 6 (six) hours as needed for allergies.   lidocaine (LIDODERM) 5 % Place 1 patch onto the skin daily. Remove & Discard patch within 12 hours or as directed by MD   LORazepam (ATIVAN) 0.5 MG tablet Take 0.5 tablets by mouth  every 12 (twelve) hours as needed for anxiety.   LORazepam (ATIVAN) 0.5 MG tablet Take 2 tablets (1 mg total) by mouth 2 (two) times daily as needed for anxiety.   losartan (COZAAR) 100 MG tablet Take 100 mg by mouth at bedtime.   nitroGLYCERIN (NITROSTAT) 0.4 MG SL tablet Place 1 tablet (0.4 mg total) under the tongue every 5 (five) minutes as needed for chest pain.   omeprazole (PRILOSEC) 20 MG capsule Take 1 capsule by mouth daily as needed (acid reflux).   pravastatin (PRAVACHOL) 40 MG tablet Take 1 tablet by mouth daily.   TURMERIC PO Take 1 tablet by mouth daily.     Allergies:   Iodine, Aspirin, Clonidine derivatives, Metoprolol, Diltiazem, and Latex   Social History   Socioeconomic History   Marital status: Divorced    Spouse name: Not on file   Number of children: Not on file   Years of education: Not on file   Highest education level: Not on file  Occupational History   Not on file  Tobacco Use   Smoking status: Some Days    Types: Cigarettes   Smokeless tobacco: Never  Vaping Use   Vaping Use: Every day  Substance and Sexual Activity   Alcohol use: Not Currently   Drug use: Not Currently   Sexual activity: Not on file  Other Topics Concern   Not on file  Social History Narrative   Not on file   Social  Determinants of Health   Financial Resource Strain: Not on file  Food Insecurity: Not on file  Transportation Needs: Not on file  Physical Activity: Not on file  Stress: Not on file  Social Connections: Not on file     Family History: The patient's family history includes Cancer in her daughter, mother, and sister; Diabetes in her mother; Stroke in her father. ROS:   Please see the history of present illness.    All other systems reviewed and are negative.  EKGs/Labs/Other Studies Reviewed:    The following studies were reviewed today:    Recent Labs: 08/14/2021: BUN 7; Creatinine, Ser 0.82; Hemoglobin 12.6; Platelets 199; Potassium 4.0; Sodium 137  Recent Lipid Panel No results found for: CHOL, TRIG, HDL, CHOLHDL, VLDL, LDLCALC, LDLDIRECT  Physical Exam:    VS:  BP 138/80 (BP Location: Right Arm, Patient Position: Sitting, Cuff Size: Normal)    Pulse 98    Ht 5\' 5"  (1.651 m)    Wt 119 lb (54 kg)    SpO2 100%    BMI 19.80 kg/m     Wt Readings from Last 3 Encounters:  09/04/21 119 lb (54 kg)  08/06/21 123 lb (55.8 kg)  05/17/20 120 lb (54.4 kg)     GEN:  Well nourished, well developed in no acute distress HEENT: Normal NECK: No JVD; No carotid bruits LYMPHATICS: No lymphadenopathy CARDIAC: Irregular rhythm RRR, no murmurs, rubs, gallops RESPIRATORY:  Clear to auscultation without rales, wheezing or rhonchi  ABDOMEN: Soft, non-tender, non-distended MUSCULOSKELETAL:  No edema; No deformity  SKIN: Warm and dry NEUROLOGIC:  Alert and oriented x 3 PSYCHIATRIC:  Normal affect    Signed, Shirlee More, MD  09/04/2021 12:05 PM    [place]

## 2021-09-04 ENCOUNTER — Ambulatory Visit: Payer: Medicare Other | Admitting: Cardiology

## 2021-09-04 ENCOUNTER — Encounter: Payer: Self-pay | Admitting: Cardiology

## 2021-09-04 ENCOUNTER — Other Ambulatory Visit: Payer: Self-pay

## 2021-09-04 VITALS — BP 138/80 | HR 98 | Ht 65.0 in | Wt 119.0 lb

## 2021-09-04 DIAGNOSIS — E782 Mixed hyperlipidemia: Secondary | ICD-10-CM

## 2021-09-04 DIAGNOSIS — I119 Hypertensive heart disease without heart failure: Secondary | ICD-10-CM | POA: Diagnosis not present

## 2021-09-04 DIAGNOSIS — I480 Paroxysmal atrial fibrillation: Secondary | ICD-10-CM | POA: Diagnosis not present

## 2021-09-04 NOTE — Patient Instructions (Addendum)
Medication Instructions:  Your physician recommends that you continue on your current medications as directed. Please refer to the Current Medication list given to you today.  *If you need a refill on your cardiac medications before your next appointment, please call your pharmacy*   Lab Work: None If you have labs (blood work) drawn today and your tests are completely normal, you will receive your results only by: [place] (if you have MyChart) OR A paper copy in the mail If you have any lab test that is abnormal or we need to change your treatment, we will call you to review the results.   Testing/Procedures: None   Follow-Up: At [facility], you and your health needs are our priority.  As part of our continuing mission to provide you with exceptional heart care, we have created designated Provider Care Teams.  These Care Teams include your primary Cardiologist (physician) and Advanced Practice Providers (APPs -  Physician Assistants and Nurse Practitioners) who all work together to provide you with the care you need, when you need it.  We recommend signing up for the patient portal called "MyChart".  Sign up information is provided on this After Visit Summary.  MyChart is used to connect with patients for Virtual Visits (Telemedicine).  Patients are able to view lab/test results, encounter notes, upcoming appointments, etc.  Non-urgent messages can be sent to your provider as well.   To learn more about what you can do with MyChart, go to [email or web address].    Your next appointment:   6 month(s)  The format for your next appointment:   In Person  Provider:   Shirlee More, MD    Other Instructions  1. Avoid all over-the-counter antihistamines except Claritin/Loratadine and Zyrtec/Cetrizine. 2. Avoid all combination including cold sinus allergies flu decongestant and sleep medications 3. You can use Robitussin DM Mucinex and Mucinex DM for cough. 4. can use  Tylenol aspirin ibuprofen and naproxen but no combinations such as sleep or sinus.

## 2021-10-16 DIAGNOSIS — J449 Chronic obstructive pulmonary disease, unspecified: Secondary | ICD-10-CM | POA: Diagnosis not present

## 2021-10-16 DIAGNOSIS — I480 Paroxysmal atrial fibrillation: Secondary | ICD-10-CM | POA: Diagnosis not present

## 2021-10-16 DIAGNOSIS — E785 Hyperlipidemia, unspecified: Secondary | ICD-10-CM | POA: Diagnosis not present

## 2021-10-16 DIAGNOSIS — I10 Essential (primary) hypertension: Secondary | ICD-10-CM | POA: Diagnosis not present

## 2021-10-16 DIAGNOSIS — Z6820 Body mass index (BMI) 20.0-20.9, adult: Secondary | ICD-10-CM | POA: Diagnosis not present

## 2021-12-11 DIAGNOSIS — I10 Essential (primary) hypertension: Secondary | ICD-10-CM | POA: Diagnosis not present

## 2021-12-11 DIAGNOSIS — I480 Paroxysmal atrial fibrillation: Secondary | ICD-10-CM | POA: Diagnosis not present

## 2021-12-11 DIAGNOSIS — R42 Dizziness and giddiness: Secondary | ICD-10-CM | POA: Diagnosis not present

## 2021-12-11 DIAGNOSIS — H539 Unspecified visual disturbance: Secondary | ICD-10-CM | POA: Diagnosis not present

## 2021-12-11 DIAGNOSIS — R55 Syncope and collapse: Secondary | ICD-10-CM | POA: Diagnosis not present

## 2021-12-17 DIAGNOSIS — I6523 Occlusion and stenosis of bilateral carotid arteries: Secondary | ICD-10-CM | POA: Diagnosis not present

## 2021-12-17 DIAGNOSIS — R55 Syncope and collapse: Secondary | ICD-10-CM | POA: Diagnosis not present

## 2021-12-17 DIAGNOSIS — R42 Dizziness and giddiness: Secondary | ICD-10-CM | POA: Diagnosis not present

## 2022-01-14 DIAGNOSIS — I10 Essential (primary) hypertension: Secondary | ICD-10-CM | POA: Diagnosis not present

## 2022-01-14 DIAGNOSIS — R42 Dizziness and giddiness: Secondary | ICD-10-CM | POA: Diagnosis not present

## 2022-01-14 DIAGNOSIS — E785 Hyperlipidemia, unspecified: Secondary | ICD-10-CM | POA: Diagnosis not present

## 2022-01-14 DIAGNOSIS — J449 Chronic obstructive pulmonary disease, unspecified: Secondary | ICD-10-CM | POA: Diagnosis not present

## 2022-01-14 DIAGNOSIS — I480 Paroxysmal atrial fibrillation: Secondary | ICD-10-CM | POA: Diagnosis not present

## 2022-01-14 DIAGNOSIS — R55 Syncope and collapse: Secondary | ICD-10-CM | POA: Diagnosis not present

## 2022-02-25 DIAGNOSIS — L814 Other melanin hyperpigmentation: Secondary | ICD-10-CM | POA: Diagnosis not present

## 2022-02-25 DIAGNOSIS — D225 Melanocytic nevi of trunk: Secondary | ICD-10-CM | POA: Diagnosis not present

## 2022-02-25 DIAGNOSIS — D2239 Melanocytic nevi of other parts of face: Secondary | ICD-10-CM | POA: Diagnosis not present

## 2022-02-25 DIAGNOSIS — L821 Other seborrheic keratosis: Secondary | ICD-10-CM | POA: Diagnosis not present

## 2022-05-13 DIAGNOSIS — E785 Hyperlipidemia, unspecified: Secondary | ICD-10-CM | POA: Diagnosis not present

## 2022-05-13 DIAGNOSIS — Z0000 Encounter for general adult medical examination without abnormal findings: Secondary | ICD-10-CM | POA: Diagnosis not present

## 2022-05-13 DIAGNOSIS — Z139 Encounter for screening, unspecified: Secondary | ICD-10-CM | POA: Diagnosis not present

## 2022-05-13 DIAGNOSIS — Z9181 History of falling: Secondary | ICD-10-CM | POA: Diagnosis not present

## 2022-05-17 DIAGNOSIS — M810 Age-related osteoporosis without current pathological fracture: Secondary | ICD-10-CM | POA: Diagnosis not present

## 2022-05-17 DIAGNOSIS — H9193 Unspecified hearing loss, bilateral: Secondary | ICD-10-CM | POA: Diagnosis not present

## 2022-05-17 DIAGNOSIS — E785 Hyperlipidemia, unspecified: Secondary | ICD-10-CM | POA: Diagnosis not present

## 2022-05-17 DIAGNOSIS — K219 Gastro-esophageal reflux disease without esophagitis: Secondary | ICD-10-CM | POA: Diagnosis not present

## 2022-05-17 DIAGNOSIS — I480 Paroxysmal atrial fibrillation: Secondary | ICD-10-CM | POA: Diagnosis not present

## 2022-05-17 DIAGNOSIS — I10 Essential (primary) hypertension: Secondary | ICD-10-CM | POA: Diagnosis not present

## 2022-05-17 DIAGNOSIS — J449 Chronic obstructive pulmonary disease, unspecified: Secondary | ICD-10-CM | POA: Diagnosis not present

## 2022-08-21 DIAGNOSIS — H52223 Regular astigmatism, bilateral: Secondary | ICD-10-CM | POA: Diagnosis not present

## 2022-08-21 DIAGNOSIS — H35372 Puckering of macula, left eye: Secondary | ICD-10-CM | POA: Diagnosis not present

## 2022-08-21 DIAGNOSIS — H5201 Hypermetropia, right eye: Secondary | ICD-10-CM | POA: Diagnosis not present

## 2022-08-21 DIAGNOSIS — H353131 Nonexudative age-related macular degeneration, bilateral, early dry stage: Secondary | ICD-10-CM | POA: Diagnosis not present

## 2022-08-21 DIAGNOSIS — H524 Presbyopia: Secondary | ICD-10-CM | POA: Diagnosis not present

## 2022-09-17 DIAGNOSIS — E785 Hyperlipidemia, unspecified: Secondary | ICD-10-CM | POA: Diagnosis not present

## 2022-09-17 DIAGNOSIS — H9193 Unspecified hearing loss, bilateral: Secondary | ICD-10-CM | POA: Diagnosis not present

## 2022-09-17 DIAGNOSIS — M810 Age-related osteoporosis without current pathological fracture: Secondary | ICD-10-CM | POA: Diagnosis not present

## 2022-09-17 DIAGNOSIS — I10 Essential (primary) hypertension: Secondary | ICD-10-CM | POA: Diagnosis not present

## 2022-09-17 DIAGNOSIS — K219 Gastro-esophageal reflux disease without esophagitis: Secondary | ICD-10-CM | POA: Diagnosis not present

## 2022-09-17 DIAGNOSIS — J449 Chronic obstructive pulmonary disease, unspecified: Secondary | ICD-10-CM | POA: Diagnosis not present

## 2022-09-17 DIAGNOSIS — I480 Paroxysmal atrial fibrillation: Secondary | ICD-10-CM | POA: Diagnosis not present

## 2022-10-18 DIAGNOSIS — I10 Essential (primary) hypertension: Secondary | ICD-10-CM | POA: Diagnosis not present

## 2022-12-02 DIAGNOSIS — I10 Essential (primary) hypertension: Secondary | ICD-10-CM | POA: Diagnosis not present

## 2022-12-02 DIAGNOSIS — R590 Localized enlarged lymph nodes: Secondary | ICD-10-CM | POA: Diagnosis not present

## 2022-12-17 DIAGNOSIS — K5730 Diverticulosis of large intestine without perforation or abscess without bleeding: Secondary | ICD-10-CM | POA: Diagnosis not present

## 2022-12-17 DIAGNOSIS — K5900 Constipation, unspecified: Secondary | ICD-10-CM | POA: Diagnosis not present

## 2022-12-17 DIAGNOSIS — I700 Atherosclerosis of aorta: Secondary | ICD-10-CM | POA: Diagnosis not present

## 2022-12-17 DIAGNOSIS — R590 Localized enlarged lymph nodes: Secondary | ICD-10-CM | POA: Diagnosis not present

## 2022-12-17 DIAGNOSIS — K4020 Bilateral inguinal hernia, without obstruction or gangrene, not specified as recurrent: Secondary | ICD-10-CM | POA: Diagnosis not present

## 2022-12-17 DIAGNOSIS — M47816 Spondylosis without myelopathy or radiculopathy, lumbar region: Secondary | ICD-10-CM | POA: Diagnosis not present

## 2023-01-07 IMAGING — CT CT HEAD W/O CM
3 series · 16 of 47 positions shown, 19 images · non-contrast
Comparison: None.

CLINICAL DATA: Head trauma, nausea.

EXAM:
CT HEAD WITHOUT CONTRAST
TECHNIQUE: Contiguous axial images were obtained from the base of the skull
through the vertex without intravenous contrast.

[Series 3: head 5.0 h30s · axial · 0.42mm/px · z∈[-148,+7]mm · 10 of 37 slices shown, 13 images]
[im 3/37  brain]
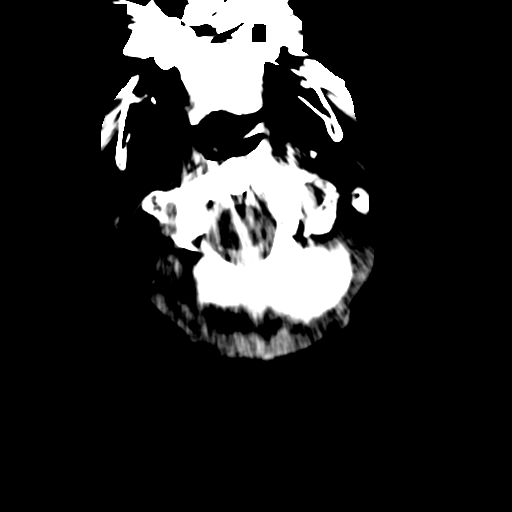
[im 3/37  bone]
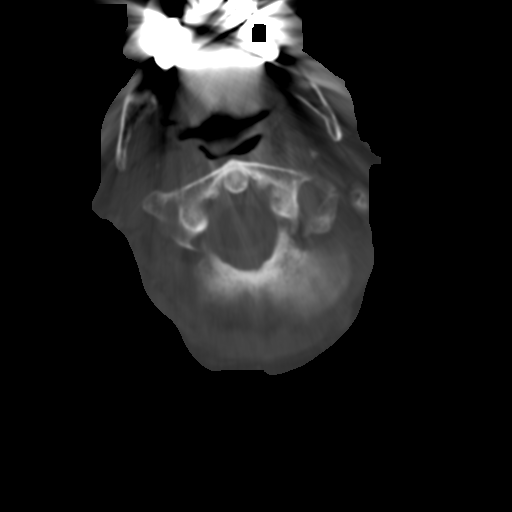
[im 7/37  brain]
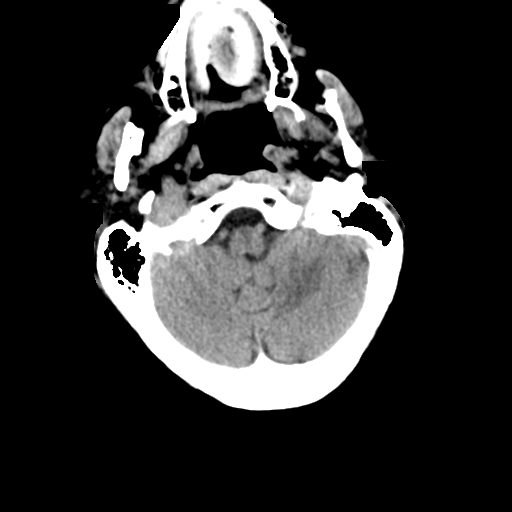
[im 10/37  brain]
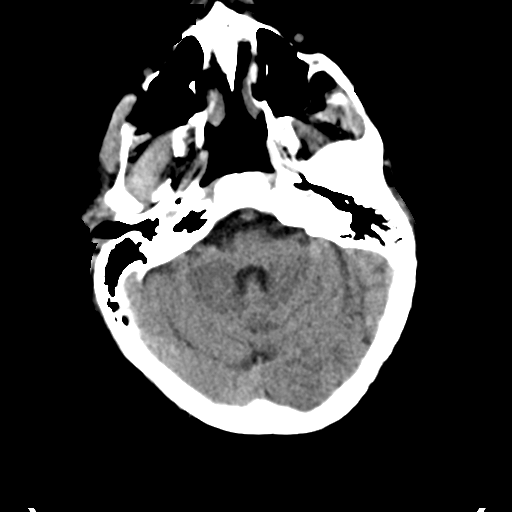
[im 13/37  brain]
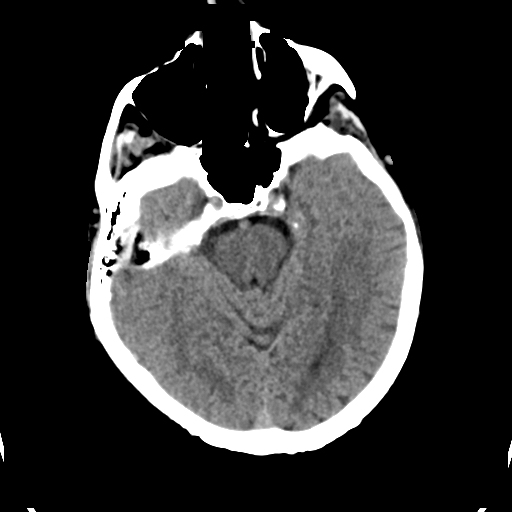
[im 17/37  brain]
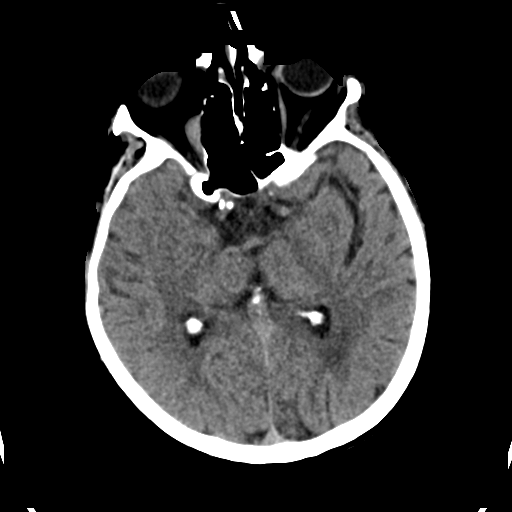
[im 17/37  bone]
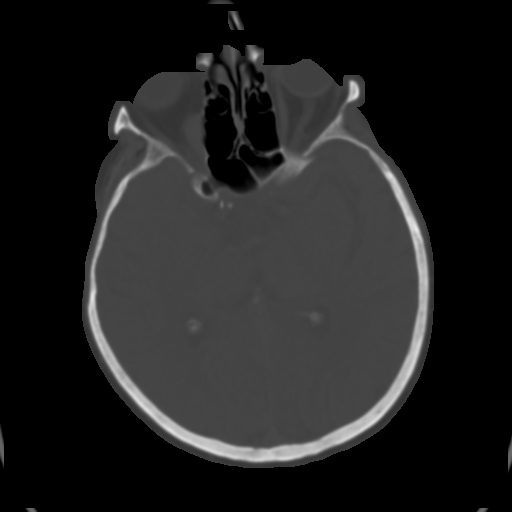
[im 20/37  brain]
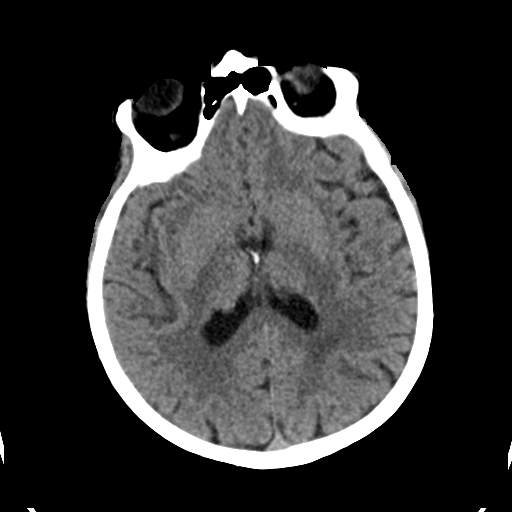
[im 24/37  brain]
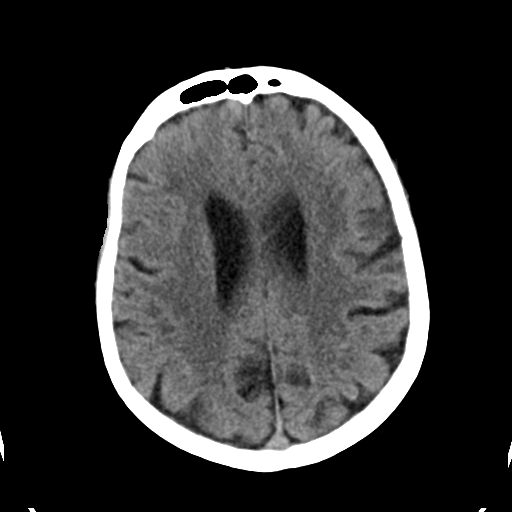
[im 28/37  brain]
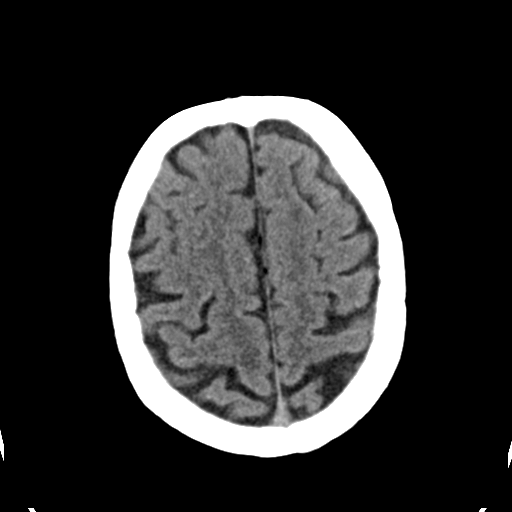
[im 30/37  brain]
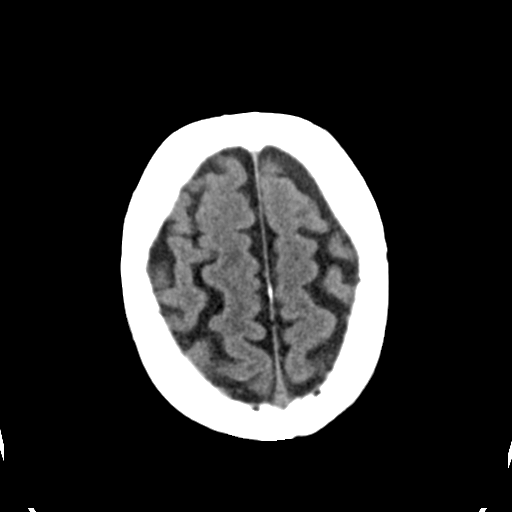
[im 30/37  bone]
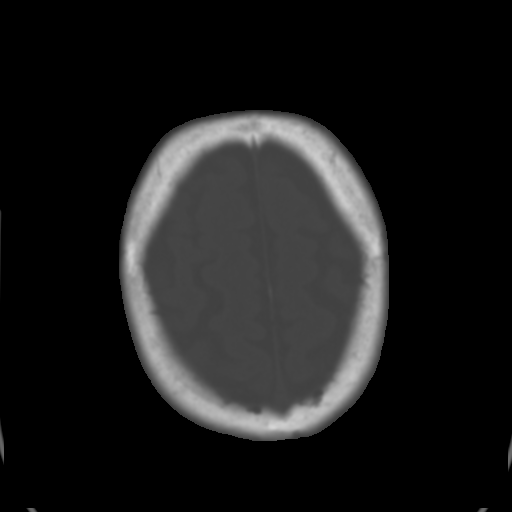
[im 34/37  brain]
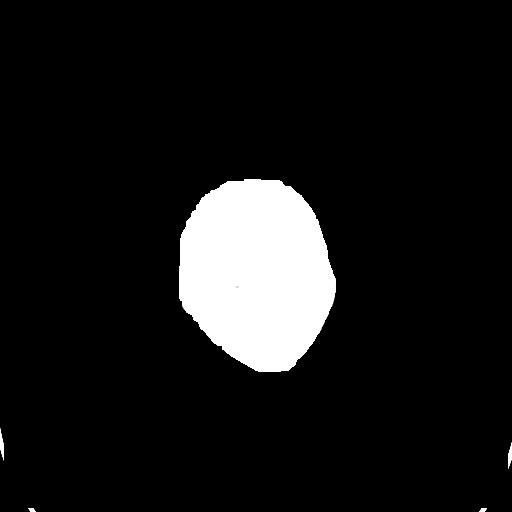

[Series 5: head 3.0 mpr cor · coronal · 0.36mm/px · 3 of 71 slices shown]
[im 24/71  brain]
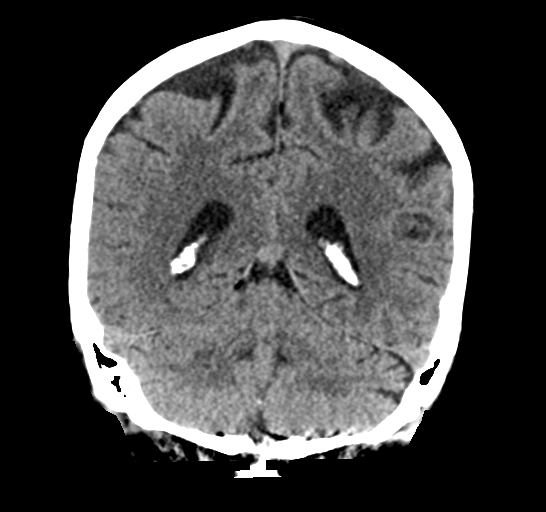
[im 32/71  brain]
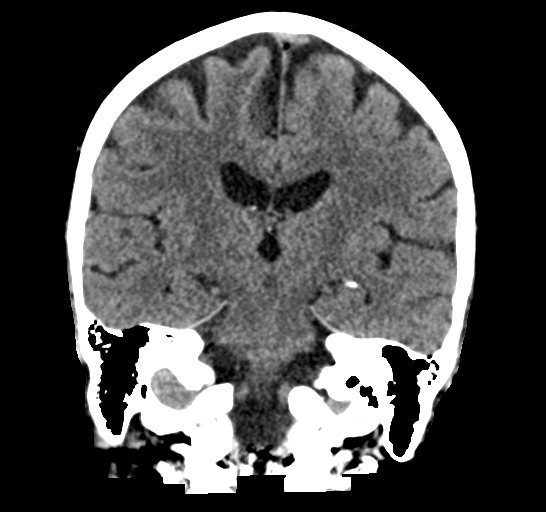
[im 39/71  brain]
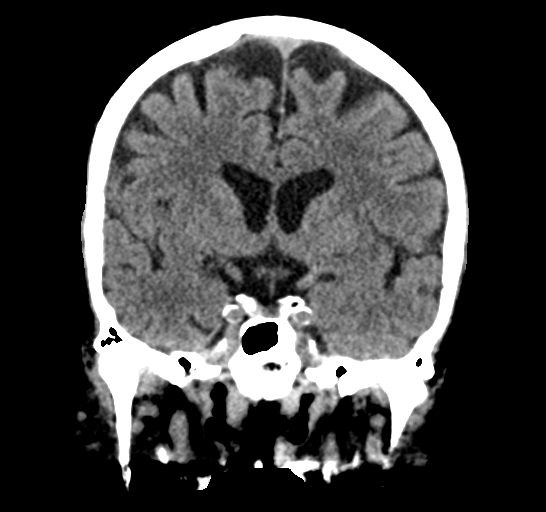

[Series 6: head 3.0 mpr sag · sagittal · 0.36mm/px · 3 of 66 slices shown]
[im 22/66  brain]
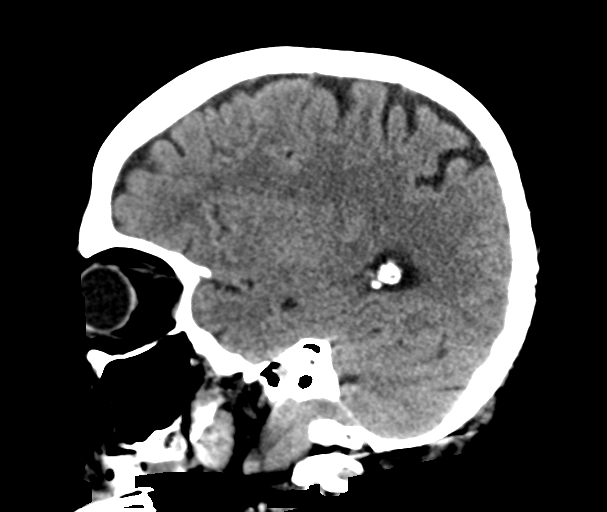
[im 33/66  brain]
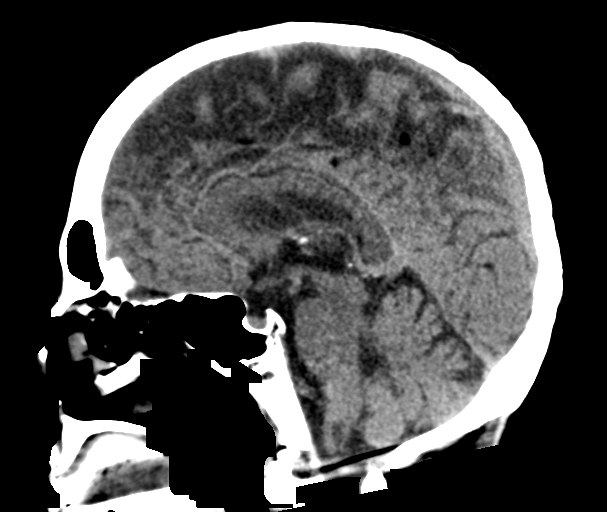
[im 44/66  brain]
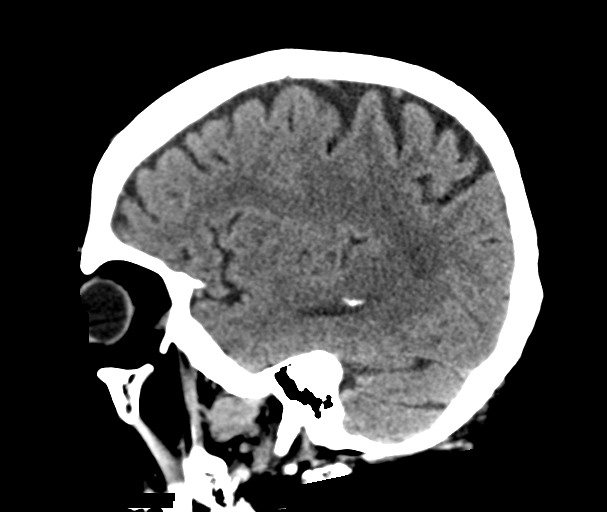

[16 of 47 positions shown; findings below may reference images not displayed]

FINDINGS: Brain: No acute intracranial hemorrhage. No focal mass lesion. No CT
evidence of acute infarction. No midline shift or mass effect. No
hydrocephalus. Basilar cisterns are patent.

There are periventricular and subcortical white matter
hypodensities. Generalized cortical atrophy.

Vascular: No hyperdense vessel or unexpected calcification.

Skull: Normal. Negative for fracture or focal lesion.

Sinuses/Orbits: Paranasal sinuses and mastoid air cells are clear.
Orbits are clear.

Other: None.
IMPRESSION: 1. No acute intracranial findings.
2. Atrophy and white matter microvascular disease

## 2023-01-15 DIAGNOSIS — R590 Localized enlarged lymph nodes: Secondary | ICD-10-CM | POA: Diagnosis not present

## 2023-01-15 DIAGNOSIS — Z853 Personal history of malignant neoplasm of breast: Secondary | ICD-10-CM | POA: Diagnosis not present

## 2023-01-16 DIAGNOSIS — R590 Localized enlarged lymph nodes: Secondary | ICD-10-CM | POA: Diagnosis not present

## 2023-01-16 DIAGNOSIS — I10 Essential (primary) hypertension: Secondary | ICD-10-CM | POA: Diagnosis not present

## 2023-01-20 ENCOUNTER — Telehealth: Payer: Self-pay | Admitting: Oncology

## 2023-01-20 NOTE — Telephone Encounter (Signed)
01/20/23 Spoke with patient and scheduled New patient appt with Dr Angelene Giovanni

## 2023-01-21 ENCOUNTER — Encounter: Payer: Self-pay | Admitting: Oncology

## 2023-01-21 ENCOUNTER — Inpatient Hospital Stay: Payer: Medicare Other | Attending: Oncology | Admitting: Oncology

## 2023-01-21 VITALS — BP 174/71 | HR 70 | Temp 97.7°F | Resp 14 | Ht 63.6 in | Wt 112.3 lb

## 2023-01-21 DIAGNOSIS — Z853 Personal history of malignant neoplasm of breast: Secondary | ICD-10-CM

## 2023-01-21 DIAGNOSIS — Z809 Family history of malignant neoplasm, unspecified: Secondary | ICD-10-CM

## 2023-01-21 DIAGNOSIS — R591 Generalized enlarged lymph nodes: Secondary | ICD-10-CM | POA: Diagnosis not present

## 2023-01-21 NOTE — Progress Notes (Signed)
Rebekah Hunter:  Patient Care Team: Hurshel Party, NP as PCP - General (Internal Medicine) Simoncic, Gay Filler, DMD (Dentistry)  CHIEF COMPLAINTS/PURPOSE OF CONSULTATION:   HISTORY OF PRESENTING ILLNESS: Rebekah Hunter 82 y.o. female is here because of  adenopathy  Medical history notable for paroxysmal atrial fibrillation, ischemic heart disease, macular degeneration, hypertension, remote history of EtOH abuse, COPD, GERD  December 02, 2022: Presented to PCP with mass in right groin which she noted 3 days prior.    WBC 7.9 hemoglobin 13.1 MCV 86 platelet count 201; 54 segs 32 lymphs 10 monos 3 eos 1 basophil  December 17 2022:  CT pelvis new enlarged iliac chain lymph node anterior to the left ovary measuring 3.8 x 2.7 cm.  Shotty left inguinal lymph nodes largest 1.3 x 0.7 cm.  Right inguinal chain node 1.4 x 1.7 cm  Jan 15, 2023:  PET/CT obtained to evaluate inguinal adenopathy.  Mild pulmonary scarring with central airway thickening.  Atherosclerosis of great vessels and coronary artery disease.  Small calcified granulomas in calcified mediastinal and left hilar lymph nodes multiple calcified splenic granulomas.  Probable hypermetabolic node in left upper quadrant posterior to pancreatic tail along splenic hilum 1.9 x 1.3 cm (SUV 7.1).  Enlarged pelvic lymph nodes intensely hypermetabolic, 4.0 x 3.2 cm left external iliac lymph node (SUV 6.9) right inguinal lymph node 1.8 x 1.2 cm (SUV 6.1) tiny minute hypermetabolic left inguinal lymph node.  Indeterminate hypermetabolic lesion within spinal canal at T11 likely meningioma   Jan 21 2023:  [facility] Hematology Consult  Patient was diagnosed with cancer of left breast diagnosed age 68 for which she underwent bilateral mastectomy with reconstruction.  Was noted to have DCIS right breast.  Did not need chemotherapy or XRT nor did she receive tamoxifen.   Last colonoscopy was about 4 yrs ago.    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  Formerly used EtOH.  Smokes up to 1/2 ppd  [facility] Mother died 45 sarcoma Father died 61 CVA Sister alive 50 retinitis pigmentosa cancer in abdomen Sister alive 51 well Brother alive 47 melanoma on neck Brother alive 80 afib, mobility problems  Patient has 3 adult children Eldest (daughter) breast cancer at 15  Review of Systems  Constitutional:  Negative for appetite change, chills, fatigue, fever and unexpected weight change.  HENT:   Negative for mouth sores, nosebleeds, sore throat, trouble swallowing and voice change.   Eyes:  Negative for eye problems and icterus.       Vision changes:  None  Respiratory:  Negative for chest tightness, cough, hemoptysis and wheezing.        DOE walking up a long hill  Cardiovascular:  Negative for chest pain, leg swelling and palpitations.       PND:  none Orthopnea:  none  Gastrointestinal:  Negative for abdominal pain, blood in stool, constipation, diarrhea and vomiting.       Occasional nausea  Endocrine: Negative for hot flashes.       Cold intolerance:  none Heat intolerance:  none  Genitourinary:  Negative for bladder incontinence, difficulty urinating, dysuria, frequency, hematuria and nocturia.   Musculoskeletal:  Negative for gait problem, myalgias, neck pain and neck stiffness.       Chronic arthralgias and back pain  Skin:  Negative for itching, rash and wound.  Neurological:  Negative for extremity weakness, gait problem, headaches, light-headedness and speech difficulty.  Occasional numbness right hand related to old orthopedic injury  Hematological:  Negative for adenopathy. Does not bruise/bleed easily.    MEDICAL HISTORY: Past Medical History:  Diagnosis Date   Alcoholism (HCC) 06/11/2016   Anxiety 09/13/2015   Last Assessment & Plan:  She is doing better with this as a whole she feels continue with her current dose of med   Arthritis 09/13/2015   Last Assessment & Plan:  Formatting of  this note might be different from the original. She feels this is stable for her and if she is more mobile then she feels better as a whole and she is trying to be more with this   Basal cell carcinoma    Close exposure to COVID-19 virus 05/17/2020   Demand ischemia 10/20/2016   Ductal carcinoma in situ (DCIS) of breast    Essential hypertension 09/13/2015   Last Assessment & Plan:  This is stable for her at this time and will follow along   Essential tremor 09/13/2015   Last Assessment & Plan:  This is stable for her and will follow   Fecal incontinence 09/13/2015   Last Assessment & Plan:  Formatting of this note might be different from the original. This is improved for her with eating more fruits and vegetables and avoiding beef and rich red meds   High risk medications (not anticoagulants) long-term use 09/13/2015   Hyperlipidemia, unspecified 09/13/2015   Last Assessment & Plan:  Update her lipids for her fasting   Hypertensive heart disease without heart failure 10/20/2016   Malaise and fatigue 09/13/2015   Last Assessment & Plan:  Formatting of this note might be different from the original. Update her labs for her she is getting better with getting out more and being active   Persistent atrial fibrillation (HCC) 10/20/2016   No anticoag from fall risk.  CHADS2 vasc=4   Primary insomnia 09/13/2015   Last Assessment & Plan:  She feels this is stable for her at this time and will follow   Recurrent major depressive disorder (HCC) 09/13/2015   Last Assessment & Plan:  Psychiatrist has said we can follow her now and she is comfortable with this as well on her remeron and her ativan and will be taking this over in about month for refills for her she will let us know when she needs this   Substance induced mood disorder (HCC) 07/25/2016   Vitamin D deficiency 11/20/2015   Last Assessment & Plan:  Formatting of this note might be different from the original. She takes this orally will  follow    SURGICAL HISTORY: Past Surgical History:  Procedure Laterality Date   BREAST ENHANCEMENT SURGERY     MASTECTOMY     TONSILLECTOMY      SOCIAL HISTORY: Social History   Socioeconomic History   Marital status: Divorced    Spouse name: Not on file   Number of children: Not on file   Years of education: Not on file   Highest education level: Not on file  Occupational History   Not on file  Tobacco Use   Smoking status: Some Days    Types: Cigarettes   Smokeless tobacco: Never  Vaping Use   Vaping Use: Never used  Substance and Sexual Activity   Alcohol use: Not Currently   Drug use: Never   Sexual activity: Not on file  Other Topics Concern   Not on file  Social History Narrative   Not on file   Social  Determinants of Health   Financial Resource Strain: Not on file  Food Insecurity: No Food Insecurity (01/21/2023)   Hunger Vital Sign    Worried About Running Out of Food in the Last Year: Never true    Ran Out of Food in the Last Year: Never true  Transportation Needs: Not on file  Physical Activity: Not on file  Stress: Not on file  Social Connections: Not on file  Intimate Partner Violence: Not At Risk (01/21/2023)   Humiliation, Afraid, Rape, and Kick questionnaire    Fear of Current or Ex-Partner: No    Emotionally Abused: No    Physically Abused: No    Sexually Abused: No    FAMILY HISTORY Family History  Problem Relation Age of Onset   Other Mother        Sarcoma   Stroke Father    Cancer Sister    Multiple myeloma Sister    Endocrine tumor Sister    Other Sister        Wagnor's   Melanoma Brother    Breast cancer Daughter     ALLERGIES:  is allergic to iodine, aspirin, clonidine derivatives, metoprolol, diltiazem, and latex.  MEDICATIONS:  Current Outpatient Medications  Medication Sig Dispense Refill   acetaminophen (TYLENOL) 500 MG tablet Take 500 mg by mouth every 6 (six) hours as needed for mild pain.     amLODipine (NORVASC) 5  MG tablet Take 5 mg by mouth daily. Takes 0.5 tablet (2.5 mg) daily     Cholecalciferol (VITAMIN D3) 3000 units TABS Take 1 tablet by mouth daily.     LORazepam (ATIVAN) 0.5 MG tablet Take 0.5 tablets by mouth every 12 (twelve) hours as needed for anxiety.     losartan (COZAAR) 100 MG tablet Take 100 mg by mouth at bedtime.     omeprazole (PRILOSEC) 20 MG capsule Take 1 capsule by mouth daily as needed (acid reflux).  3   pravastatin (PRAVACHOL) 40 MG tablet Take 1 tablet by mouth daily.     mirtazapine (REMERON) 15 MG tablet Take 1 tablet by mouth daily.     nitroGLYCERIN (NITROSTAT) 0.4 MG SL tablet Place 1 tablet (0.4 mg total) under the tongue every 5 (five) minutes as needed for chest pain. 25 tablet 11   No current facility-administered medications for this Hunter.    PHYSICAL EXAMINATION:  ECOG PERFORMANCE STATUS: 0 - Asymptomatic   Vitals:   01/21/23 1506  BP: (!) 174/71  Pulse: 70  Resp: 14  Temp: 97.7 F (36.5 C)  SpO2: 96%    Filed Weights   01/21/23 1506  Weight: 112 lb 4.8 oz (50.9 kg)     Physical Exam Vitals and nursing note reviewed.  Constitutional:      Appearance: Normal appearance. She is not toxic-appearing or diaphoretic.     Comments: Thin  HENT:     Head: Normocephalic and atraumatic.     Right Ear: External ear normal.     Left Ear: External ear normal.     Nose: Nose normal. No congestion or rhinorrhea.  Eyes:     General: No scleral icterus.    Extraocular Movements: Extraocular movements intact.     Conjunctiva/sclera: Conjunctivae normal.     Pupils: Pupils are equal, round, and reactive to light.  Cardiovascular:     Rate and Rhythm: Normal rate.     Heart sounds: No murmur heard.    No friction rub. No gallop.  Pulmonary:     Effort:  Pulmonary effort is normal. No respiratory distress.     Breath sounds: Normal breath sounds. No stridor. No wheezing.  Abdominal:     General: Bowel sounds are normal.     Palpations: Abdomen is  soft.     Tenderness: There is no abdominal tenderness. There is no guarding or rebound.  Musculoskeletal:        General: No swelling, tenderness or deformity.     Cervical back: Normal range of motion and neck supple. No rigidity or tenderness.     Right lower leg: No edema.     Left lower leg: No edema.  Lymphadenopathy:     Head:     Right side of head: No submental, submandibular, tonsillar, preauricular, posterior auricular or occipital adenopathy.     Left side of head: No submental, submandibular, tonsillar, preauricular, posterior auricular or occipital adenopathy.     Cervical: No cervical adenopathy.     Right cervical: No superficial, deep or posterior cervical adenopathy.    Left cervical: No superficial, deep or posterior cervical adenopathy.     Upper Body:     Right upper body: No supraclavicular, axillary, pectoral or epitrochlear adenopathy.     Left upper body: No supraclavicular, axillary, pectoral or epitrochlear adenopathy.     Lower Body: Right inguinal adenopathy present.     Comments: 1 cm right inguinal LN, firm and mobile  Skin:    General: Skin is warm.     Coloration: Skin is not jaundiced.  Neurological:     General: No focal deficit present.     Mental Status: She is alert and oriented to person, place, and time.     Cranial Nerves: No cranial nerve deficit.     Motor: No weakness.  Psychiatric:        Mood and Affect: Mood normal.        Behavior: Behavior normal.        Thought Content: Thought content normal.        Judgment: Judgment normal.      LABORATORY DATA: I have personally reviewed the data as listed:  No visits with results within 1 Month(s) from this Hunter.  Latest known Hunter with results is:  Admission on 08/14/2021, Discharged on 08/14/2021  Component Date Value Ref Range Status   WBC 08/14/2021 15.8 (H)  4.0 - 10.5 K/uL Final   RBC 08/14/2021 4.29  3.87 - 5.11 MIL/uL Final   Hemoglobin 08/14/2021 12.6  12.0 - 15.0 g/dL  Final   HCT 29/56/2130 38.7  36.0 - 46.0 % Final   MCV 08/14/2021 90.2  80.0 - 100.0 fL Final   MCH 08/14/2021 29.4  26.0 - 34.0 pg Final   MCHC 08/14/2021 32.6  30.0 - 36.0 g/dL Final   RDW 86/57/8469 13.0  11.5 - 15.5 % Final   Platelets 08/14/2021 199  150 - 400 K/uL Final   nRBC 08/14/2021 0.0  0.0 - 0.2 % Final   Performed at [facility], [address] [place], [place] [address]   Sodium 08/14/2021 137  135 - 145 mmol/L Final   Potassium 08/14/2021 4.0  3.5 - 5.1 mmol/L Final   Chloride 08/14/2021 104  98 - 111 mmol/L Final   CO2 08/14/2021 24  22 - 32 mmol/L Final   Glucose, Bld 08/14/2021 122 (H)  70 - 99 mg/dL Final   Glucose reference range applies only to samples taken after fasting for at least 8 hours.   BUN 08/14/2021 7 (  L)  8 - 23 mg/dL Final   Creatinine, Ser 08/14/2021 0.82  0.44 - 1.00 mg/dL Final   Calcium 98/07/9146 9.2  8.9 - 10.3 mg/dL Final   GFR, Estimated 08/14/2021 >60  >60 mL/min Final   Comment: (NOTE) Calculated using the CKD-EPI Creatinine Equation (2021)    Anion gap 08/14/2021 9  5 - 15 Final   Performed at [facility], [address] [place], [place] [address]   Color, Urine 08/14/2021 YELLOW  YELLOW Final   APPearance 08/14/2021 CLEAR  CLEAR Final   Specific Gravity, Urine 08/14/2021 1.025  1.005 - 1.030 Final   pH 08/14/2021 5.5  5.0 - 8.0 Final   Glucose, UA 08/14/2021 NEGATIVE  NEGATIVE mg/dL Final   Hgb urine dipstick 08/14/2021 NEGATIVE  NEGATIVE Final   Bilirubin Urine 08/14/2021 NEGATIVE  NEGATIVE Final   Ketones, ur 08/14/2021 NEGATIVE  NEGATIVE mg/dL Final   Protein, ur 21/30/8657 NEGATIVE  NEGATIVE mg/dL Final   Nitrite 84/69/6295 NEGATIVE  NEGATIVE Final   Leukocytes,Ua 08/14/2021 NEGATIVE  NEGATIVE Final   Microscopic not done on urines with negative protein, blood, leukocytes, nitrite, or glucose < 500 mg/dL.   RBC / HPF 08/14/2021 0-5  0 - 5 RBC/hpf Final   WBC, UA 08/14/2021 0-5  0 - 5 WBC/hpf Final   Bacteria, UA  08/14/2021 RARE (A)  NONE SEEN Final   Squamous Epithelial / HPF 08/14/2021 0-5  0 - 5 Final   Mucus 08/14/2021 PRESENT   Final   Hyaline Casts, UA 08/14/2021 PRESENT   Final   Performed at [facility], [address], [place], [place] [address]    RADIOGRAPHIC STUDIES: I have personally reviewed the radiological images as listed and agree with the findings in the report  No results found.  ASSESSMENT/PLAN 82 y.o. female is here because of  inguinal adenopathy.  Medical history notable for paroxysmal atrial fibrillation, ischemic heart disease, macular degeneration, hypertension, remote history of EtOH abuse, COPD, GERD  Inguinal adenopathy:    December 17 2022 - CT pelvis new enlarged iliac chain lymph node anterior to left ovary 3.8 x 2.7 cm.  Shotty left inguinal lymph nodes largest 1.3 x 0.7 cm.  Right inguinal chain node 1.4 x 1.7 cm   Jan 15, 2023-  PET/CT.  Small calcified granulomas in calcified mediastinal and left hilar lymph nodes multiple calcified splenic granulomas.  Probable hypermetabolic node in left upper quadrant posterior to pancreatic tail along splenic hilum 1.9 x 1.3 cm (SUV 7.1).  Enlarged pelvic lymph nodes intensely hypermetabolic, 4.0 x 3.2 cm left external iliac lymph node (SUV 6.9) right inguinal lymph node 1.8 x 1.2 cm (SUV 6.1) tiny minute hypermetabolic left inguinal lymph node.    Jan 21 2023- Recommended to patient that she undergo U/S guided core needle bx and aspirate to evaluate adenopathy.  Material should be sent for usual pathologic studies and flow for lymphoma   Personal and family history of malignancy Jan 21 2023- Recommend consideration of genetics consult as it has the potential to benefit offspring and grand children     Cancer Staging  No matching staging information was found for the patient.   No problem-specific Assessment & Plan notes found for this encounter.    Orders Placed This Encounter  Procedures   [place] CORE BIOPSY (LYMPH  NODES)    Pleas also send flow for lymphoma    Standing Status:   Future    Standing Expiration Date:   01/21/2024  Order Specific Question:   Lab orders requested (DO NOT place separate lab orders, these will be automatically ordered during procedure specimen collection):    Answer:   Surgical Pathology    Order Specific Question:   Lab orders requested (DO NOT place separate lab orders, these will be automatically ordered during procedure specimen collection):    Answer:   Other    Order Specific Question:   Lab orders requested (DO NOT place separate lab orders, these will be automatically ordered during procedure specimen collection):    Answer:   Cytology - Non Pap    Order Specific Question:   Reason for Exam (SYMPTOM  OR DIAGNOSIS REQUIRED)    Answer:   inguinal adenopathy    Order Specific Question:   Preferred location?    Answer:   [facility]    60  minutes was spent in patient care.  This included time spent preparing to see the patient (e.g., review of tests), obtaining and/or reviewing separately obtained history, counseling and educating the patient/family/caregiver, ordering medications, tests, or procedures; documenting clinical information in the electronic or other health record, independently interpreting results and communicating results to the patient/family/caregiver as well as coordination of care.       All questions were answered. The patient knows to call the clinic with any problems, questions or concerns.  This note was electronically signed.    Loni Muse, MD  01/21/2023 5:19 PM

## 2023-01-22 NOTE — Progress Notes (Signed)
Rebekah Dance, MD  Georgeann Oppenheim for [place] core bx RT inguinal PET+ adenopathy  See PET 5/1  TS

## 2023-01-22 NOTE — Progress Notes (Signed)
Theodoro Doing D Will reach out to patient and see if we can schedule at [facility]       Previous Messages    ----- Message ----- From: Leodis Rains D Sent: 01/22/2023   3:49 PM EDT To: Thomasena Edis; Loni Muse, MD Subject: [place] BX                                          I called you patient to schedule her [place] BX request and she advised that she does not have any transportation to come to [facility] for this appt.  Please advise how to proceed.  Thanks [employer]

## 2023-01-28 DIAGNOSIS — R590 Localized enlarged lymph nodes: Secondary | ICD-10-CM | POA: Diagnosis not present

## 2023-01-28 DIAGNOSIS — C801 Malignant (primary) neoplasm, unspecified: Secondary | ICD-10-CM | POA: Diagnosis not present

## 2023-01-28 DIAGNOSIS — C774 Secondary and unspecified malignant neoplasm of inguinal and lower limb lymph nodes: Secondary | ICD-10-CM | POA: Diagnosis not present

## 2023-01-31 NOTE — Progress Notes (Unsigned)
[facility] Cancer Follow up Visit:  Patient Care Team: Hurshel Party, NP as PCP - General (Internal Medicine) Simoncic, Gay Filler, DMD (Dentistry)  CHIEF COMPLAINTS/PURPOSE OF CONSULTATION:   HISTORY OF PRESENTING ILLNESS: Rebekah Hunter 82 y.o. female is here because of  adenopathy  Medical history notable for paroxysmal atrial fibrillation, ischemic heart disease, macular degeneration, hypertension, remote history of EtOH abuse, COPD, GERD  December 02, 2022: Presented to PCP with mass in right groin which she noted 3 days prior.    WBC 7.9 hemoglobin 13.1 MCV 86 platelet count 201; 54 segs 32 lymphs 10 monos 3 eos 1 basophil  December 17 2022:  CT pelvis new enlarged iliac chain lymph node anterior to the left ovary measuring 3.8 x 2.7 cm.  Shotty left inguinal lymph nodes largest 1.3 x 0.7 cm.  Right inguinal chain node 1.4 x 1.7 cm  Jan 15, 2023:  PET/CT obtained to evaluate inguinal adenopathy.  Mild pulmonary scarring with central airway thickening.  Atherosclerosis of great vessels and coronary artery disease.  Small calcified granulomas in calcified mediastinal and left hilar lymph nodes multiple calcified splenic granulomas.  Probable hypermetabolic node in left upper quadrant posterior to pancreatic tail along splenic hilum 1.9 x 1.3 cm (SUV 7.1).  Enlarged pelvic lymph nodes intensely hypermetabolic, 4.0 x 3.2 cm left external iliac lymph node (SUV 6.9) right inguinal lymph node 1.8 x 1.2 cm (SUV 6.1) tiny minute hypermetabolic left inguinal lymph node.  Indeterminate hypermetabolic lesion within spinal canal at T11 likely meningioma   Jan 21 2023:  [facility] Hematology Consult  Patient was diagnosed with cancer of left breast diagnosed age 69 for which she underwent bilateral mastectomy with reconstruction.  Was noted to have DCIS right breast.  Did not need chemotherapy or XRT nor did she receive tamoxifen.   Last colonoscopy was about 4 yrs ago.    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  Formerly used EtOH.  Smokes up to 1/2 ppd  [facility] Mother died 48 sarcoma Father died 40 CVA Sister alive 4 retinitis pigmentosa cancer in abdomen Sister alive 25 well Brother alive 51 melanoma on neck Brother alive 31 afib, mobility problems  Patient has 3 adult children Eldest (daughter) breast cancer at 35  Review of Systems  Constitutional:  Negative for appetite change, chills, fatigue, fever and unexpected weight change.  HENT:   Negative for mouth sores, nosebleeds, sore throat, trouble swallowing and voice change.   Eyes:  Negative for eye problems and icterus.       Vision changes:  None  Respiratory:  Negative for chest tightness, cough, hemoptysis and wheezing.        DOE walking up a long hill  Cardiovascular:  Negative for chest pain, leg swelling and palpitations.       PND:  none Orthopnea:  none  Gastrointestinal:  Negative for abdominal pain, blood in stool, constipation, diarrhea and vomiting.       Occasional nausea  Endocrine: Negative for hot flashes.       Cold intolerance:  none Heat intolerance:  none  Genitourinary:  Negative for bladder incontinence, difficulty urinating, dysuria, frequency, hematuria and nocturia.   Musculoskeletal:  Negative for gait problem, myalgias, neck pain and neck stiffness.       Chronic arthralgias and back pain  Skin:  Negative for itching, rash and wound.  Neurological:  Negative for extremity weakness, gait problem, headaches, light-headedness and speech difficulty.  Occasional numbness right hand related to old orthopedic injury  Hematological:  Negative for adenopathy. Does not bruise/bleed easily.    MEDICAL HISTORY: Past Medical History:  Diagnosis Date   Alcoholism (HCC) 06/11/2016   Anxiety 09/13/2015   Last Assessment & Plan:  She is doing better with this as a whole she feels continue with her current dose of med   Arthritis 09/13/2015   Last Assessment & Plan:  Formatting of  this note might be different from the original. She feels this is stable for her and if she is more mobile then she feels better as a whole and she is trying to be more with this   Basal cell carcinoma    Close exposure to COVID-19 virus 05/17/2020   Demand ischemia 10/20/2016   Ductal carcinoma in situ (DCIS) of breast    Essential hypertension 09/13/2015   Last Assessment & Plan:  This is stable for her at this time and will follow along   Essential tremor 09/13/2015   Last Assessment & Plan:  This is stable for her and will follow   Fecal incontinence 09/13/2015   Last Assessment & Plan:  Formatting of this note might be different from the original. This is improved for her with eating more fruits and vegetables and avoiding beef and rich red meds   High risk medications (not anticoagulants) long-term use 09/13/2015   Hyperlipidemia, unspecified 09/13/2015   Last Assessment & Plan:  Update her lipids for her fasting   Hypertensive heart disease without heart failure 10/20/2016   Malaise and fatigue 09/13/2015   Last Assessment & Plan:  Formatting of this note might be different from the original. Update her labs for her she is getting better with getting out more and being active   Persistent atrial fibrillation (HCC) 10/20/2016   No anticoag from fall risk.  CHADS2 vasc=4   Primary insomnia 09/13/2015   Last Assessment & Plan:  She feels this is stable for her at this time and will follow   Recurrent major depressive disorder (HCC) 09/13/2015   Last Assessment & Plan:  Psychiatrist has said we can follow her now and she is comfortable with this as well on her remeron and her ativan and will be taking this over in about month for refills for her she will let us know when she needs this   Substance induced mood disorder (HCC) 07/25/2016   Vitamin D deficiency 11/20/2015   Last Assessment & Plan:  Formatting of this note might be different from the original. She takes this orally will  follow    SURGICAL HISTORY: Past Surgical History:  Procedure Laterality Date   BREAST ENHANCEMENT SURGERY     MASTECTOMY     TONSILLECTOMY      SOCIAL HISTORY: Social History   Socioeconomic History   Marital status: Divorced    Spouse name: Not on file   Number of children: Not on file   Years of education: Not on file   Highest education level: Not on file  Occupational History   Not on file  Tobacco Use   Smoking status: Some Days    Types: Cigarettes   Smokeless tobacco: Never  Vaping Use   Vaping Use: Never used  Substance and Sexual Activity   Alcohol use: Not Currently   Drug use: Never   Sexual activity: Not on file  Other Topics Concern   Not on file  Social History Narrative   Not on file   Social  Determinants of Health   Financial Resource Strain: Not on file  Food Insecurity: No Food Insecurity (01/21/2023)   Hunger Vital Sign    Worried About Running Out of Food in the Last Year: Never true    Ran Out of Food in the Last Year: Never true  Transportation Needs: Not on file  Physical Activity: Not on file  Stress: Not on file  Social Connections: Not on file  Intimate Partner Violence: Not At Risk (01/21/2023)   Humiliation, Afraid, Rape, and Kick questionnaire    Fear of Current or Ex-Partner: No    Emotionally Abused: No    Physically Abused: No    Sexually Abused: No    FAMILY HISTORY Family History  Problem Relation Age of Onset   Other Mother        Sarcoma   Stroke Father    Cancer Sister    Multiple myeloma Sister    Endocrine tumor Sister    Other Sister        Wagnor's   Melanoma Brother    Breast cancer Daughter     ALLERGIES:  is allergic to iodine, aspirin, clonidine derivatives, metoprolol, diltiazem, and latex.  MEDICATIONS:  Current Outpatient Medications  Medication Sig Dispense Refill   acetaminophen (TYLENOL) 500 MG tablet Take 500 mg by mouth every 6 (six) hours as needed for mild pain.     amLODipine (NORVASC) 5  MG tablet Take 5 mg by mouth daily. Takes 0.5 tablet (2.5 mg) daily     Cholecalciferol (VITAMIN D3) 3000 units TABS Take 1 tablet by mouth daily.     LORazepam (ATIVAN) 0.5 MG tablet Take 0.5 tablets by mouth every 12 (twelve) hours as needed for anxiety.     losartan (COZAAR) 100 MG tablet Take 100 mg by mouth at bedtime.     mirtazapine (REMERON) 15 MG tablet Take 1 tablet by mouth daily.     nitroGLYCERIN (NITROSTAT) 0.4 MG SL tablet Place 1 tablet (0.4 mg total) under the tongue every 5 (five) minutes as needed for chest pain. 25 tablet 11   omeprazole (PRILOSEC) 20 MG capsule Take 1 capsule by mouth daily as needed (acid reflux).  3   pravastatin (PRAVACHOL) 40 MG tablet Take 1 tablet by mouth daily.     No current facility-administered medications for this visit.    PHYSICAL EXAMINATION:  ECOG PERFORMANCE STATUS: 0 - Asymptomatic   There were no vitals filed for this visit.   There were no vitals filed for this visit.    Physical Exam Vitals and nursing note reviewed.  Constitutional:      Appearance: Normal appearance. She is not toxic-appearing or diaphoretic.     Comments: Thin  HENT:     Head: Normocephalic and atraumatic.     Right Ear: External ear normal.     Left Ear: External ear normal.     Nose: Nose normal. No congestion or rhinorrhea.  Eyes:     General: No scleral icterus.    Extraocular Movements: Extraocular movements intact.     Conjunctiva/sclera: Conjunctivae normal.     Pupils: Pupils are equal, round, and reactive to light.  Cardiovascular:     Rate and Rhythm: Normal rate.     Heart sounds: No murmur heard.    No friction rub. No gallop.  Pulmonary:     Effort: Pulmonary effort is normal. No respiratory distress.     Breath sounds: Normal breath sounds. No stridor. No wheezing.  Abdominal:  General: Bowel sounds are normal.     Palpations: Abdomen is soft.     Tenderness: There is no abdominal tenderness. There is no guarding or  rebound.  Musculoskeletal:        General: No swelling, tenderness or deformity.     Cervical back: Normal range of motion and neck supple. No rigidity or tenderness.     Right lower leg: No edema.     Left lower leg: No edema.  Lymphadenopathy:     Head:     Right side of head: No submental, submandibular, tonsillar, preauricular, posterior auricular or occipital adenopathy.     Left side of head: No submental, submandibular, tonsillar, preauricular, posterior auricular or occipital adenopathy.     Cervical: No cervical adenopathy.     Right cervical: No superficial, deep or posterior cervical adenopathy.    Left cervical: No superficial, deep or posterior cervical adenopathy.     Upper Body:     Right upper body: No supraclavicular, axillary, pectoral or epitrochlear adenopathy.     Left upper body: No supraclavicular, axillary, pectoral or epitrochlear adenopathy.     Lower Body: Right inguinal adenopathy present.     Comments: 1 cm right inguinal LN, firm and mobile  Skin:    General: Skin is warm.     Coloration: Skin is not jaundiced.  Neurological:     General: No focal deficit present.     Mental Status: She is alert and oriented to person, place, and time.     Cranial Nerves: No cranial nerve deficit.     Motor: No weakness.  Psychiatric:        Mood and Affect: Mood normal.        Behavior: Behavior normal.        Thought Content: Thought content normal.        Judgment: Judgment normal.      LABORATORY DATA: I have personally reviewed the data as listed:  No visits with results within 1 Month(s) from this visit.  Latest known visit with results is:  Admission on 08/14/2021, Discharged on 08/14/2021  Component Date Value Ref Range Status   WBC 08/14/2021 15.8 (H)  4.0 - 10.5 K/uL Final   RBC 08/14/2021 4.29  3.87 - 5.11 MIL/uL Final   Hemoglobin 08/14/2021 12.6  12.0 - 15.0 g/dL Final   HCT 16/06/9603 38.7  36.0 - 46.0 % Final   MCV 08/14/2021 90.2  80.0 -  100.0 fL Final   MCH 08/14/2021 29.4  26.0 - 34.0 pg Final   MCHC 08/14/2021 32.6  30.0 - 36.0 g/dL Final   RDW 54/05/8118 13.0  11.5 - 15.5 % Final   Platelets 08/14/2021 199  150 - 400 K/uL Final   nRBC 08/14/2021 0.0  0.0 - 0.2 % Final   Performed at [facility], [address], [place], [place] [address]   Sodium 08/14/2021 137  135 - 145 mmol/L Final   Potassium 08/14/2021 4.0  3.5 - 5.1 mmol/L Final   Chloride 08/14/2021 104  98 - 111 mmol/L Final   CO2 08/14/2021 24  22 - 32 mmol/L Final   Glucose, Bld 08/14/2021 122 (H)  70 - 99 mg/dL Final   Glucose reference range applies only to samples taken after fasting for at least 8 hours.   BUN 08/14/2021 7 (L)  8 - 23 mg/dL Final   Creatinine, Ser 08/14/2021 0.82  0.44 - 1.00 mg/dL Final   Calcium 95/62/1308 9.2  8.9 -  10.3 mg/dL Final   GFR, Estimated 08/14/2021 >60  >60 mL/min Final   Comment: (NOTE) Calculated using the CKD-EPI Creatinine Equation (2021)    Anion gap 08/14/2021 9  5 - 15 Final   Performed at [facility], [address] [place], [place] [address]   Color, Urine 08/14/2021 YELLOW  YELLOW Final   APPearance 08/14/2021 CLEAR  CLEAR Final   Specific Gravity, Urine 08/14/2021 1.025  1.005 - 1.030 Final   pH 08/14/2021 5.5  5.0 - 8.0 Final   Glucose, UA 08/14/2021 NEGATIVE  NEGATIVE mg/dL Final   Hgb urine dipstick 08/14/2021 NEGATIVE  NEGATIVE Final   Bilirubin Urine 08/14/2021 NEGATIVE  NEGATIVE Final   Ketones, ur 08/14/2021 NEGATIVE  NEGATIVE mg/dL Final   Protein, ur 60/45/4098 NEGATIVE  NEGATIVE mg/dL Final   Nitrite 11/91/4782 NEGATIVE  NEGATIVE Final   Leukocytes,Ua 08/14/2021 NEGATIVE  NEGATIVE Final   Microscopic not done on urines with negative protein, blood, leukocytes, nitrite, or glucose < 500 mg/dL.   RBC / HPF 08/14/2021 0-5  0 - 5 RBC/hpf Final   WBC, UA 08/14/2021 0-5  0 - 5 WBC/hpf Final   Bacteria, UA 08/14/2021 RARE (A)  NONE SEEN Final   Squamous Epithelial / HPF 08/14/2021 0-5   0 - 5 Final   Mucus 08/14/2021 PRESENT   Final   Hyaline Casts, UA 08/14/2021 PRESENT   Final   Performed at [facility], [address], [place], [place] [address]    RADIOGRAPHIC STUDIES: I have personally reviewed the radiological images as listed and agree with the findings in the report  No results found.  ASSESSMENT/PLAN 82 y.o. female is here because of  inguinal adenopathy.  Medical history notable for paroxysmal atrial fibrillation, ischemic heart disease, macular degeneration, hypertension, remote history of EtOH abuse, COPD, GERD  Inguinal adenopathy:    December 17 2022 - CT pelvis new enlarged iliac chain lymph node anterior to left ovary 3.8 x 2.7 cm.  Shotty left inguinal lymph nodes largest 1.3 x 0.7 cm.  Right inguinal chain node 1.4 x 1.7 cm   Jan 15, 2023-  PET/CT.  Small calcified granulomas in calcified mediastinal and left hilar lymph nodes multiple calcified splenic granulomas.  Probable hypermetabolic node in left upper quadrant posterior to pancreatic tail along splenic hilum 1.9 x 1.3 cm (SUV 7.1).  Enlarged pelvic lymph nodes intensely hypermetabolic, 4.0 x 3.2 cm left external iliac lymph node (SUV 6.9) right inguinal lymph node 1.8 x 1.2 cm (SUV 6.1) tiny minute hypermetabolic left inguinal lymph node.    Jan 21 2023- Recommended to patient that she undergo U/S guided core needle bx and aspirate to evaluate adenopathy.  Material should be sent for usual pathologic studies and flow for lymphoma   Personal and family history of malignancy Jan 21 2023- Recommend consideration of genetics consult as it has the potential to benefit offspring and grand children     Cancer Staging  No matching staging information was found for the patient.   No problem-specific Assessment & Plan notes found for this encounter.    No orders of the defined types were placed in this encounter.   60  minutes was spent in patient care.  This included time spent preparing to see  the patient (e.g., review of tests), obtaining and/or reviewing separately obtained history, counseling and educating the patient/family/caregiver, ordering medications, tests, or procedures; documenting clinical information in the electronic or other health record, independently interpreting results and communicating  results to the patient/family/caregiver as well as coordination of care.       All questions were answered. The patient knows to call the clinic with any problems, questions or concerns.  This note was electronically signed.    Loni Muse, MD  01/31/2023 1:29 PM

## 2023-02-04 ENCOUNTER — Inpatient Hospital Stay: Payer: Medicare Other

## 2023-02-04 ENCOUNTER — Inpatient Hospital Stay: Payer: Medicare Other | Admitting: Oncology

## 2023-02-04 VITALS — BP 164/73 | HR 88 | Temp 97.9°F | Resp 14 | Ht 63.6 in | Wt 112.7 lb

## 2023-02-04 DIAGNOSIS — C649 Malignant neoplasm of unspecified kidney, except renal pelvis: Secondary | ICD-10-CM

## 2023-02-04 NOTE — Patient Instructions (Signed)
You have stage IV kidney cancer which we can potentially treat with immunotherapy and targeted therapy.  The drugs I am thinking of using to treat you are Pembrolizumab and Axitinib.

## 2023-02-07 NOTE — Progress Notes (Unsigned)
[facility] Cancer Follow up Visit:  Patient Care Team: Hurshel Party, NP as PCP - General (Internal Medicine) Simoncic, Gay Filler, DMD (Dentistry)  CHIEF COMPLAINTS/PURPOSE OF CONSULTATION:    HISTORY OF PRESENTING ILLNESS: Rebekah Hunter 82 y.o. female is here because of  adenopathy  Medical history notable for paroxysmal atrial fibrillation, ischemic heart disease, macular degeneration, hypertension, remote history of EtOH abuse, COPD, GERD  December 02, 2022: Presented to PCP with mass in right groin which she noted 3 days prior.    WBC 7.9 hemoglobin 13.1 MCV 86 platelet count 201; 54 segs 32 lymphs 10 monos 3 eos 1 basophil  December 17 2022:  CT pelvis new enlarged iliac chain lymph node anterior to the left ovary measuring 3.8 x 2.7 cm.  Shotty left inguinal lymph nodes largest 1.3 x 0.7 cm.  Right inguinal chain node 1.4 x 1.7 cm  Jan 15, 2023:  PET/CT obtained to evaluate inguinal adenopathy.  Mild pulmonary scarring with central airway thickening.  Atherosclerosis of great vessels and coronary artery disease.  Small calcified granulomas in calcified mediastinal and left hilar lymph nodes multiple calcified splenic granulomas.  Probable hypermetabolic node in left upper quadrant posterior to pancreatic tail along splenic hilum 1.9 x 1.3 cm (SUV 7.1).  Enlarged pelvic lymph nodes intensely hypermetabolic, 4.0 x 3.2 cm left external iliac lymph node (SUV 6.9) right inguinal lymph node 1.8 x 1.2 cm (SUV 6.1) tiny minute hypermetabolic left inguinal lymph node.  Indeterminate hypermetabolic lesion within spinal canal at T11 likely meningioma   Jan 21 2023:  [facility] Hematology Consult  Patient was diagnosed with cancer of left breast diagnosed age 41 for which she underwent bilateral mastectomy with reconstruction.  Was noted to have DCIS right breast.  Did not need chemotherapy or XRT nor did she receive tamoxifen.   Last colonoscopy was about 4 yrs ago.    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  Formerly used EtOH.  Smokes up to 1/2 ppd  [facility] Mother died 60 sarcoma Father died 69 CVA Sister alive 49 retinitis pigmentosa cancer in abdomen Sister alive 34 well Brother alive 28 melanoma on neck Brother alive 36 afib, mobility problems  Patient has 3 adult children Eldest (daughter) breast cancer at 48  Jan 28 2023:  U/S guided core biopsy of right inguinal lymph node.  Pathology demonstrated metastatic poorly differentiated carcinoma.  Immunohistochemistry favored to renal origin.  Feb 04, 2023: Scheduled follow-up regarding adenopathy.  Reviewed results of biopsy with patient.  Has chronic pain in lumbar region.  Assured patient that this does not appear to be related to her newly diagnosed malignancy.   Review of Systems  Constitutional:  Negative for appetite change, chills, fatigue, fever and unexpected weight change.  HENT:   Negative for mouth sores, nosebleeds, sore throat, trouble swallowing and voice change.   Eyes:  Negative for eye problems and icterus.       Vision changes:  None  Respiratory:  Negative for chest tightness, cough, hemoptysis and wheezing.        DOE walking up a long hill  Cardiovascular:  Negative for chest pain, leg swelling and palpitations.       PND:  none Orthopnea:  none  Gastrointestinal:  Negative for abdominal pain, blood in stool, constipation, diarrhea and vomiting.       Occasional nausea  Endocrine: Negative for hot flashes.       Cold intolerance:  none  Heat intolerance:  none  Genitourinary:  Negative for bladder incontinence, difficulty urinating, dysuria, frequency, hematuria and nocturia.   Musculoskeletal:  Negative for gait problem, myalgias, neck pain and neck stiffness.       Chronic arthralgias and back pain  Skin:  Negative for itching, rash and wound.  Neurological:  Negative for extremity weakness, gait problem, headaches, light-headedness and speech difficulty.       Occasional  numbness right hand related to old orthopedic injury  Hematological:  Negative for adenopathy. Does not bruise/bleed easily.    MEDICAL HISTORY: Past Medical History:  Diagnosis Date  . Alcoholism (HCC) 06/11/2016  . Anxiety 09/13/2015   Last Assessment & Plan:  She is doing better with this as a whole she feels continue with her current dose of med  . Arthritis 09/13/2015   Last Assessment & Plan:  Formatting of this note might be different from the original. She feels this is stable for her and if she is more mobile then she feels better as a whole and she is trying to be more with this  . Basal cell carcinoma   . Close exposure to COVID-19 virus 05/17/2020  . Demand ischemia 10/20/2016  . Ductal carcinoma in situ (DCIS) of breast   . Essential hypertension 09/13/2015   Last Assessment & Plan:  This is stable for her at this time and will follow along  . Essential tremor 09/13/2015   Last Assessment & Plan:  This is stable for her and will follow  . Fecal incontinence 09/13/2015   Last Assessment & Plan:  Formatting of this note might be different from the original. This is improved for her with eating more fruits and vegetables and avoiding beef and rich red meds  . High risk medications (not anticoagulants) long-term use 09/13/2015  . Hyperlipidemia, unspecified 09/13/2015   Last Assessment & Plan:  Update her lipids for her fasting  . Hypertensive heart disease without heart failure 10/20/2016  . Malaise and fatigue 09/13/2015   Last Assessment & Plan:  Formatting of this note might be different from the original. Update her labs for her she is getting better with getting out more and being active  . Persistent atrial fibrillation (HCC) 10/20/2016   No anticoag from fall risk.  CHADS2 vasc=4  . Primary insomnia 09/13/2015   Last Assessment & Plan:  She feels this is stable for her at this time and will follow  . Recurrent major depressive disorder (HCC) 09/13/2015   Last  Assessment & Plan:  Psychiatrist has said we can follow her now and she is comfortable with this as well on her remeron and her ativan and will be taking this over in about month for refills for her she will let us know when she needs this  . Substance induced mood disorder (HCC) 07/25/2016  . Vitamin D deficiency 11/20/2015   Last Assessment & Plan:  Formatting of this note might be different from the original. She takes this orally will follow    SURGICAL HISTORY: Past Surgical History:  Procedure Laterality Date  . BREAST ENHANCEMENT SURGERY    . MASTECTOMY    . TONSILLECTOMY      SOCIAL HISTORY: Social History   Socioeconomic History  . Marital status: Divorced    Spouse name: Not on file  . Number of children: Not on file  . Years of education: Not on file  . Highest education level: Not on file  Occupational History  . Not on file  Tobacco Use  . Smoking status: Some Days    Types: Cigarettes  . Smokeless tobacco: Never  Vaping Use  . Vaping Use: Never used  Substance and Sexual Activity  . Alcohol use: Not Currently  . Drug use: Never  . Sexual activity: Not on file  Other Topics Concern  . Not on file  Social History Narrative  . Not on file   Social Determinants of Health   Financial Resource Strain: Not on file  Food Insecurity: No Food Insecurity (01/21/2023)   Hunger Vital Sign   . Worried About Programme researcher, broadcasting/film/video in the Last Year: Never true   . Ran Out of Food in the Last Year: Never true  Transportation Needs: Not on file  Physical Activity: Not on file  Stress: Not on file  Social Connections: Not on file  Intimate Partner Violence: Not At Risk (01/21/2023)   Humiliation, Afraid, Rape, and Kick questionnaire   . Fear of Current or Ex-Partner: No   . Emotionally Abused: No   . Physically Abused: No   . Sexually Abused: No    FAMILY HISTORY Family History  Problem Relation Age of Onset  . Other Mother        Sarcoma  . Stroke Father   .  Cancer Sister   . Multiple myeloma Sister   . Endocrine tumor Sister   . Other Sister        Wagnor's  . Melanoma Brother   . Breast cancer Daughter     ALLERGIES:  is allergic to iodine, aspirin, clonidine derivatives, metoprolol, diltiazem, and latex.  MEDICATIONS:  Current Outpatient Medications  Medication Sig Dispense Refill  . acetaminophen (TYLENOL) 500 MG tablet Take 500 mg by mouth every 6 (six) hours as needed for mild pain.    Marland Kitchen amLODipine (NORVASC) 5 MG tablet Take 5 mg by mouth daily. Takes 0.5 tablet (2.5 mg) daily    . Cholecalciferol (VITAMIN D3) 3000 units TABS Take 1 tablet by mouth daily.    Marland Kitchen LORazepam (ATIVAN) 0.5 MG tablet Take 0.5 tablets by mouth every 12 (twelve) hours as needed for anxiety.    Marland Kitchen losartan (COZAAR) 100 MG tablet Take 100 mg by mouth at bedtime.    . mirtazapine (REMERON) 15 MG tablet Take 1 tablet by mouth daily.    . nitroGLYCERIN (NITROSTAT) 0.4 MG SL tablet Place 1 tablet (0.4 mg total) under the tongue every 5 (five) minutes as needed for chest pain. 25 tablet 11  . omeprazole (PRILOSEC) 20 MG capsule Take 1 capsule by mouth daily as needed (acid reflux).  3  . pravastatin (PRAVACHOL) 40 MG tablet Take 1 tablet by mouth daily.     No current facility-administered medications for this visit.    PHYSICAL EXAMINATION:  ECOG PERFORMANCE STATUS: 0 - Asymptomatic   There were no vitals filed for this visit.   There were no vitals filed for this visit.    Physical Exam Vitals and nursing note reviewed.  Constitutional:      Appearance: Normal appearance. She is not toxic-appearing or diaphoretic.     Comments: Thin  HENT:     Head: Normocephalic and atraumatic.     Right Ear: External ear normal.     Left Ear: External ear normal.     Nose: Nose normal. No congestion or rhinorrhea.  Eyes:     General: No scleral icterus.    Extraocular Movements: Extraocular movements intact.     Conjunctiva/sclera: Conjunctivae normal.  Pupils: Pupils are equal, round, and reactive to light.  Cardiovascular:     Rate and Rhythm: Normal rate.     Heart sounds: No murmur heard.    No friction rub. No gallop.  Pulmonary:     Effort: Pulmonary effort is normal. No respiratory distress.     Breath sounds: Normal breath sounds. No stridor. No wheezing.  Abdominal:     General: Bowel sounds are normal.     Palpations: Abdomen is soft.     Tenderness: There is no abdominal tenderness. There is no guarding or rebound.  Musculoskeletal:        General: No swelling, tenderness or deformity.     Cervical back: Normal range of motion and neck supple. No rigidity or tenderness.     Right lower leg: No edema.     Left lower leg: No edema.  Lymphadenopathy:     Head:     Right side of head: No submental, submandibular, tonsillar, preauricular, posterior auricular or occipital adenopathy.     Left side of head: No submental, submandibular, tonsillar, preauricular, posterior auricular or occipital adenopathy.     Cervical: No cervical adenopathy.     Right cervical: No superficial, deep or posterior cervical adenopathy.    Left cervical: No superficial, deep or posterior cervical adenopathy.     Upper Body:     Right upper body: No supraclavicular, axillary, pectoral or epitrochlear adenopathy.     Left upper body: No supraclavicular, axillary, pectoral or epitrochlear adenopathy.     Lower Body: Right inguinal adenopathy present.     Comments: 1 cm right inguinal LN, firm and mobile  Skin:    General: Skin is warm.     Coloration: Skin is not jaundiced.  Neurological:     General: No focal deficit present.     Mental Status: She is alert and oriented to person, place, and time.     Cranial Nerves: No cranial nerve deficit.     Motor: No weakness.  Psychiatric:        Mood and Affect: Mood normal.        Behavior: Behavior normal.        Thought Content: Thought content normal.        Judgment: Judgment normal.      LABORATORY DATA: I have personally reviewed the data as listed:  No visits with results within 1 Month(s) from this visit.  Latest known visit with results is:  Admission on 08/14/2021, Discharged on 08/14/2021  Component Date Value Ref Range Status  . WBC 08/14/2021 15.8 (H)  4.0 - 10.5 K/uL Final  . RBC 08/14/2021 4.29  3.87 - 5.11 MIL/uL Final  . Hemoglobin 08/14/2021 12.6  12.0 - 15.0 g/dL Final  . HCT 16/06/9603 38.7  36.0 - 46.0 % Final  . MCV 08/14/2021 90.2  80.0 - 100.0 fL Final  . MCH 08/14/2021 29.4  26.0 - 34.0 pg Final  . MCHC 08/14/2021 32.6  30.0 - 36.0 g/dL Final  . RDW 54/05/8118 13.0  11.5 - 15.5 % Final  . Platelets 08/14/2021 199  150 - 400 K/uL Final  . nRBC 08/14/2021 0.0  0.0 - 0.2 % Final   Performed at [facility], [address], [place], [place] [address]  . Sodium 08/14/2021 137  135 - 145 mmol/L Final  . Potassium 08/14/2021 4.0  3.5 - 5.1 mmol/L Final  . Chloride 08/14/2021 104  98 - 111 mmol/L Final  . CO2 08/14/2021  24  22 - 32 mmol/L Final  . Glucose, Bld 08/14/2021 122 (H)  70 - 99 mg/dL Final   Glucose reference range applies only to samples taken after fasting for at least 8 hours.  . BUN 08/14/2021 7 (L)  8 - 23 mg/dL Final  . Creatinine, Ser 08/14/2021 0.82  0.44 - 1.00 mg/dL Final  . Calcium 91/47/8295 9.2  8.9 - 10.3 mg/dL Final  . GFR, Estimated 08/14/2021 >60  >60 mL/min Final   Comment: (NOTE) Calculated using the CKD-EPI Creatinine Equation (2021)   . Anion gap 08/14/2021 9  5 - 15 Final   Performed at [facility], [address], [place], [place] [address]  . Color, Urine 08/14/2021 YELLOW  YELLOW Final  . APPearance 08/14/2021 CLEAR  CLEAR Final  . Specific Gravity, Urine 08/14/2021 1.025  1.005 - 1.030 Final  . pH 08/14/2021 5.5  5.0 - 8.0 Final  . Glucose, UA 08/14/2021 NEGATIVE  NEGATIVE mg/dL Final  . Hgb urine dipstick 08/14/2021 NEGATIVE  NEGATIVE Final  . Bilirubin Urine 08/14/2021 NEGATIVE  NEGATIVE  Final  . Ketones, ur 08/14/2021 NEGATIVE  NEGATIVE mg/dL Final  . Protein, ur 86/57/8469 NEGATIVE  NEGATIVE mg/dL Final  . Nitrite 62/95/2841 NEGATIVE  NEGATIVE Final  . Glori Luis 08/14/2021 NEGATIVE  NEGATIVE Final   Microscopic not done on urines with negative protein, blood, leukocytes, nitrite, or glucose < 500 mg/dL.  . RBC / HPF 08/14/2021 0-5  0 - 5 RBC/hpf Final  . WBC, UA 08/14/2021 0-5  0 - 5 WBC/hpf Final  . Bacteria, UA 08/14/2021 RARE (A)  NONE SEEN Final  . Squamous Epithelial / HPF 08/14/2021 0-5  0 - 5 Final  . Mucus 08/14/2021 PRESENT   Final  . Hyaline Casts, UA 08/14/2021 PRESENT   Final   Performed at [facility], [address], [place], [place] [address]    RADIOGRAPHIC STUDIES: I have personally reviewed the radiological images as listed and agree with the findings in the report  No results found.  ASSESSMENT/PLAN 82 y.o. female is here because of  inguinal adenopathy.  Medical history notable for paroxysmal atrial fibrillation, ischemic heart disease, macular degeneration, hypertension, remote history of EtOH abuse, COPD, GERD  Renal cell carcinoma, Left Kidney, Stage IV (T1 N1 M1) High grade :    December 17 2022 - CT pelvis new enlarged iliac chain lymph node anterior to left ovary 3.8 x 2.7 cm.  Shotty left inguinal lymph nodes largest 1.3 x 0.7 cm.  Right I  Inguinal chain node 1.4 x 1.7 cm   Jan 15, 2023-  PET/CT.  Small calcified granulomas in calcified mediastinal and left hilar lymph nodes multiple calcified splenic granulomas.  Probable hypermetabolic node in left upper quadrant posterior to pancreatic tail along splenic hilum 1.9 x 1.3 cm (SUV 7.1).  Enlarged pelvic lymph nodes intensely hypermetabolic, 4.0 x 3.2 cm left external iliac lymph node (SUV 6.9) right inguinal lymph node 1.8 x 1.2 cm (SUV 6.1) tiny minute hypermetabolic left inguinal lymph node.    Jan 21 2023- Recommended to patient that she undergo U/S guided core needle bx and aspirate  to evaluate adenopathy.  Material should be sent for usual pathologic studies and flow for lymphoma Jan 28 2023-  U/S guided bx of right inguinal lymph node. Pathology demonstrated metastatic poorly differentiated carcinoma. Immunohistochemistry favored to renal origin   Therapeutics  May 14 20214- Discussed possible treatment options with patient 1) immunotherapy +/- targeted therapy 2)  observation 3) hospice.  Goal of treatment is palliation.  Provided information about Keytruda and Axitinib.  She is struggling with the dx and wishes to discuss matters with her son before coming to a decision.  I offered a telephone visit with them as early as next week.  She will consider it   Personal and family history of malignancy Jan 21 2023- Recommend consideration of genetics consult as it has the potential to benefit offspring and grand children     Cancer Staging  Kidney cancer, primary, with metastasis from kidney to other site [facility]) Staging form: Kidney, AJCC 8th Edition - Clinical stage from 02/04/2023: Stage IV (cT1, cN1, cM1) - Signed by Loni Muse, MD on 02/04/2023 Histopathologic type: Renal cell carcinoma, NOS Stage prefix: Initial diagnosis Histologic grade (G): G3 Histologic grading system: 4 grade system    No problem-specific Assessment & Plan notes found for this encounter.    No orders of the defined types were placed in this encounter.   40  minutes was spent in patient care.  This included time spent preparing to see the patient (e.g., review of tests), obtaining and/or reviewing separately obtained history, counseling and educating the patient/family/caregiver, ordering medications, tests, or procedures; documenting clinical information in the electronic or other health record, independently interpreting results and communicating results to the patient/family/caregiver as well as coordination of care.       All questions were answered. The patient knows to call the  clinic with any problems, questions or concerns.  This note was electronically signed.    Loni Muse, MD  02/07/2023 1:09 PM

## 2023-02-12 ENCOUNTER — Inpatient Hospital Stay (INDEPENDENT_AMBULATORY_CARE_PROVIDER_SITE_OTHER): Payer: Medicare Other | Admitting: Oncology

## 2023-02-12 DIAGNOSIS — R54 Age-related physical debility: Secondary | ICD-10-CM

## 2023-02-12 DIAGNOSIS — Z7189 Other specified counseling: Secondary | ICD-10-CM

## 2023-02-12 NOTE — Progress Notes (Signed)
[facility] Cancer Initial Visit:  Patient Care Team: Hurshel Party, NP as PCP - General (Internal Medicine) Simoncic, Gay Filler, DMD (Dentistry)  CHIEF COMPLAINTS/PURPOSE OF CONSULTATION:  Oncology History  Kidney cancer, primary, with metastasis from kidney to other site [facility])  02/04/2023 Initial Diagnosis   Kidney cancer, primary, with metastasis from kidney to other site [facility])   02/04/2023 Cancer Staging   Staging form: Kidney, AJCC 8th Edition - Clinical stage from 02/04/2023: Stage IV (cT1, cN1, cM1) - Signed by Loni Muse, MD on 02/04/2023 Histopathologic type: Renal cell carcinoma, NOS Stage prefix: Initial diagnosis Histologic grade (G): G3 Histologic grading system: 4 grade system     HISTORY OF PRESENTING ILLNESS: Rebekah Hunter 82 y.o. female is here because of  renal cell cancer  Medical history notable for paroxysmal atrial fibrillation, ischemic heart disease, macular degeneration, hypertension, remote history of EtOH abuse, COPD, GERD   December 02, 2022: Presented to PCP with mass in right groin which she noted 3 days prior.     WBC 7.9 hemoglobin 13.1 MCV 86 platelet count 201; 54 segs 32 lymphs 10 monos 3 eos 1 basophil   December 17 2022:  CT pelvis new enlarged iliac chain lymph node anterior to the left ovary measuring 3.8 x 2.7 cm.  Shotty left inguinal lymph nodes largest 1.3 x 0.7 cm.  Right inguinal chain node 1.4 x 1.7 cm   Jan 15, 2023:  PET/CT obtained to evaluate inguinal adenopathy.  Mild pulmonary scarring with central airway thickening.  Atherosclerosis of great vessels and coronary artery disease.  Small calcified granulomas in calcified mediastinal and left hilar lymph nodes multiple calcified splenic granulomas.  Probable hypermetabolic node in left upper quadrant posterior to pancreatic tail along splenic hilum 1.9 x 1.3 cm (SUV 7.1).  Enlarged pelvic lymph nodes intensely hypermetabolic, 4.0 x 3.2 cm left external iliac lymph node (SUV  6.9) right inguinal lymph node 1.8 x 1.2 cm (SUV 6.1) tiny minute hypermetabolic left inguinal lymph node.  Indeterminate hypermetabolic lesion within spinal canal at T11 likely meningioma     Jan 21 2023:  [facility] Hematology Consult   Patient was diagnosed with cancer of left breast diagnosed age 25 for which she underwent bilateral mastectomy with reconstruction.  Was noted to have DCIS right breast.  Did not need chemotherapy or XRT nor did she receive tamoxifen.   Last colonoscopy was about 4 yrs ago.     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  Formerly used EtOH.  Smokes up to 1/2 ppd   [facility] Mother died 30 sarcoma Father died 59 CVA Sister alive 67 retinitis pigmentosa cancer in abdomen Sister alive 76 well Brother alive 42 melanoma on neck Brother alive 30 afib, mobility problems   Patient has 3 adult children Eldest (daughter) breast cancer at 62  Jan 28 2023:  U/S guided core biopsy of right inguinal lymph node.  Pathology demonstrated metastatic poorly differentiated carcinoma.  Immunohistochemistry favored to renal origin.   Feb 04, 2023: Scheduled follow-up regarding adenopathy.  Reviewed results of biopsy with patient.  Has chronic pain in lumbar region.  Assured patient that this does not appear to be related to her newly diagnosed malignancy.   Feb 12 2023:  Telephone visit with patient and son to discuss therapeutic options.   Review of Systems  Constitutional:  Negative for appetite change, chills, fatigue, fever and unexpected weight change.  HENT:   Negative for hearing loss,  lump/mass, mouth sores, nosebleeds, sore throat, tinnitus, trouble swallowing and voice change.   Eyes:  Negative for eye problems and icterus.       Vision changes:  None  Respiratory:  Negative for chest tightness, cough, hemoptysis, shortness of breath and wheezing.        DOE on walking up hill  Cardiovascular:  Negative for chest pain, leg swelling and palpitations.        PND:  none Orthopnea:  none  Gastrointestinal:  Negative for abdominal pain, blood in stool, constipation, diarrhea, nausea and vomiting.       Occasional nausea  Endocrine: Negative for hot flashes.       Cold intolerance:  none Heat intolerance:  none  Genitourinary:  Negative for bladder incontinence, difficulty urinating, dysuria, frequency, hematuria and nocturia.   Musculoskeletal:  Negative for gait problem, myalgias, neck pain and neck stiffness.       Chronic arthralgias and back pain  Skin:  Negative for itching, rash and wound.  Neurological:  Negative for extremity weakness, gait problem, headaches, light-headedness, numbness, seizures and speech difficulty.       Occasional numbness right hand related to old orthopedic injury  Hematological:  Negative for adenopathy. Does not bruise/bleed easily.    MEDICAL HISTORY: Past Medical History:  Diagnosis Date   Alcoholism (HCC) 06/11/2016   Anxiety 09/13/2015   Last Assessment & Plan:  She is doing better with this as a whole she feels continue with her current dose of med   Arthritis 09/13/2015   Last Assessment & Plan:  Formatting of this note might be different from the original. She feels this is stable for her and if she is more mobile then she feels better as a whole and she is trying to be more with this   Basal cell carcinoma    Close exposure to COVID-19 virus 05/17/2020   Demand ischemia 10/20/2016   Ductal carcinoma in situ (DCIS) of breast    Essential hypertension 09/13/2015   Last Assessment & Plan:  This is stable for her at this time and will follow along   Essential tremor 09/13/2015   Last Assessment & Plan:  This is stable for her and will follow   Fecal incontinence 09/13/2015   Last Assessment & Plan:  Formatting of this note might be different from the original. This is improved for her with eating more fruits and vegetables and avoiding beef and rich red meds   High risk medications (not anticoagulants)  long-term use 09/13/2015   Hyperlipidemia, unspecified 09/13/2015   Last Assessment & Plan:  Update her lipids for her fasting   Hypertensive heart disease without heart failure 10/20/2016   Malaise and fatigue 09/13/2015   Last Assessment & Plan:  Formatting of this note might be different from the original. Update her labs for her she is getting better with getting out more and being active   Persistent atrial fibrillation (HCC) 10/20/2016   No anticoag from fall risk.  CHADS2 vasc=4   Primary insomnia 09/13/2015   Last Assessment & Plan:  She feels this is stable for her at this time and will follow   Recurrent major depressive disorder (HCC) 09/13/2015   Last Assessment & Plan:  Psychiatrist has said we can follow her now and she is comfortable with this as well on her remeron and her ativan and will be taking this over in about month for refills for her she will let us know when she needs this  Substance induced mood disorder (HCC) 07/25/2016   Vitamin D deficiency 11/20/2015   Last Assessment & Plan:  Formatting of this note might be different from the original. She takes this orally will follow    SURGICAL HISTORY: Past Surgical History:  Procedure Laterality Date   BREAST ENHANCEMENT SURGERY     MASTECTOMY     TONSILLECTOMY      SOCIAL HISTORY: Social History   Socioeconomic History   Marital status: Divorced    Spouse name: Not on file   Number of children: Not on file   Years of education: Not on file   Highest education level: Not on file  Occupational History   Not on file  Tobacco Use   Smoking status: Some Days    Types: Cigarettes   Smokeless tobacco: Never  Vaping Use   Vaping Use: Never used  Substance and Sexual Activity   Alcohol use: Not Currently   Drug use: Never   Sexual activity: Not on file  Other Topics Concern   Not on file  Social History Narrative   Not on file   Social Determinants of Health   Financial Resource Strain: Not on file   Food Insecurity: No Food Insecurity (01/21/2023)   Hunger Vital Sign    Worried About Running Out of Food in the Last Year: Never true    Ran Out of Food in the Last Year: Never true  Transportation Needs: Not on file  Physical Activity: Not on file  Stress: Not on file  Social Connections: Not on file  Intimate Partner Violence: Not At Risk (01/21/2023)   Humiliation, Afraid, Rape, and Kick questionnaire    Fear of Current or Ex-Partner: No    Emotionally Abused: No    Physically Abused: No    Sexually Abused: No    FAMILY HISTORY Family History  Problem Relation Age of Onset   Other Mother        Sarcoma   Stroke Father    Cancer Sister    Multiple myeloma Sister    Endocrine tumor Sister    Other Sister        Wagnor's   Melanoma Brother    Breast cancer Daughter     ALLERGIES:  is allergic to iodine, aspirin, clonidine derivatives, metoprolol, diltiazem, and latex.  MEDICATIONS:  Current Outpatient Medications  Medication Sig Dispense Refill   acetaminophen (TYLENOL) 500 MG tablet Take 500 mg by mouth every 6 (six) hours as needed for mild pain.     amLODipine (NORVASC) 5 MG tablet Take 5 mg by mouth daily. Takes 0.5 tablet (2.5 mg) daily     Cholecalciferol (VITAMIN D3) 3000 units TABS Take 1 tablet by mouth daily.     LORazepam (ATIVAN) 0.5 MG tablet Take 0.5 tablets by mouth every 12 (twelve) hours as needed for anxiety.     losartan (COZAAR) 100 MG tablet Take 100 mg by mouth at bedtime.     mirtazapine (REMERON) 15 MG tablet Take 1 tablet by mouth daily.     nitroGLYCERIN (NITROSTAT) 0.4 MG SL tablet Place 1 tablet (0.4 mg total) under the tongue every 5 (five) minutes as needed for chest pain. 25 tablet 11   omeprazole (PRILOSEC) 20 MG capsule Take 1 capsule by mouth daily as needed (acid reflux).  3   pravastatin (PRAVACHOL) 40 MG tablet Take 1 tablet by mouth daily.     No current facility-administered medications for this visit.    PHYSICAL  EXAMINATION:  ECOG  PERFORMANCE STATUS: 1 - Symptomatic but completely ambulatory   There were no vitals filed for this visit.  There were no vitals filed for this visit.   Physical Exam  As part of a telephone visit no physical exam could be conducted  LABORATORY DATA: I have personally reviewed the data as listed:  No visits with results within 1 Month(s) from this visit.  Latest known visit with results is:  Admission on 08/14/2021, Discharged on 08/14/2021  Component Date Value Ref Range Status   WBC 08/14/2021 15.8 (H)  4.0 - 10.5 K/uL Final   RBC 08/14/2021 4.29  3.87 - 5.11 MIL/uL Final   Hemoglobin 08/14/2021 12.6  12.0 - 15.0 g/dL Final   HCT 16/06/9603 38.7  36.0 - 46.0 % Final   MCV 08/14/2021 90.2  80.0 - 100.0 fL Final   MCH 08/14/2021 29.4  26.0 - 34.0 pg Final   MCHC 08/14/2021 32.6  30.0 - 36.0 g/dL Final   RDW 54/05/8118 13.0  11.5 - 15.5 % Final   Platelets 08/14/2021 199  150 - 400 K/uL Final   nRBC 08/14/2021 0.0  0.0 - 0.2 % Final   Performed at [facility], [address], [place], [place] [address]   Sodium 08/14/2021 137  135 - 145 mmol/L Final   Potassium 08/14/2021 4.0  3.5 - 5.1 mmol/L Final   Chloride 08/14/2021 104  98 - 111 mmol/L Final   CO2 08/14/2021 24  22 - 32 mmol/L Final   Glucose, Bld 08/14/2021 122 (H)  70 - 99 mg/dL Final   Glucose reference range applies only to samples taken after fasting for at least 8 hours.   BUN 08/14/2021 7 (L)  8 - 23 mg/dL Final   Creatinine, Ser 08/14/2021 0.82  0.44 - 1.00 mg/dL Final   Calcium 95/62/1308 9.2  8.9 - 10.3 mg/dL Final   GFR, Estimated 08/14/2021 >60  >60 mL/min Final   Comment: (NOTE) Calculated using the CKD-EPI Creatinine Equation (2021)    Anion gap 08/14/2021 9  5 - 15 Final   Performed at [facility], [address] [place], [place] [address]   Color, Urine 08/14/2021 YELLOW  YELLOW Final   APPearance 08/14/2021 CLEAR  CLEAR Final   Specific Gravity, Urine 08/14/2021  1.025  1.005 - 1.030 Final   pH 08/14/2021 5.5  5.0 - 8.0 Final   Glucose, UA 08/14/2021 NEGATIVE  NEGATIVE mg/dL Final   Hgb urine dipstick 08/14/2021 NEGATIVE  NEGATIVE Final   Bilirubin Urine 08/14/2021 NEGATIVE  NEGATIVE Final   Ketones, ur 08/14/2021 NEGATIVE  NEGATIVE mg/dL Final   Protein, ur 69/62/9528 NEGATIVE  NEGATIVE mg/dL Final   Nitrite 41/32/4401 NEGATIVE  NEGATIVE Final   Leukocytes,Ua 08/14/2021 NEGATIVE  NEGATIVE Final   Microscopic not done on urines with negative protein, blood, leukocytes, nitrite, or glucose < 500 mg/dL.   RBC / HPF 08/14/2021 0-5  0 - 5 RBC/hpf Final   WBC, UA 08/14/2021 0-5  0 - 5 WBC/hpf Final   Bacteria, UA 08/14/2021 RARE (A)  NONE SEEN Final   Squamous Epithelial / HPF 08/14/2021 0-5  0 - 5 Final   Mucus 08/14/2021 PRESENT   Final   Hyaline Casts, UA 08/14/2021 PRESENT   Final   Performed at [facility], [address] [place], [place] [address]    RADIOGRAPHIC STUDIES: I have personally reviewed the radiological images as listed and agree with the findings in the report  No results  found.  ASSESSMENT/PLAN   82 y.o. female is here because of  inguinal adenopathy.  Medical history notable for paroxysmal atrial fibrillation, ischemic heart disease, macular degeneration, hypertension, remote history of EtOH abuse, COPD, GERD   Renal cell carcinoma, Left Kidney, Stage IV (T1 N1 M1) High grade :   December 17 2022 - CT pelvis new enlarged iliac chain lymph node anterior to left ovary 3.8 x 2.7 cm.  Shotty left inguinal lymph nodes largest 1.3 x 0.7 cm.  Right  Inguinal chain node 1.4 x 1.7 cm   Jan 15, 2023-  PET/CT.  Small calcified granulomas in calcified mediastinal and left hilar lymph nodes multiple calcified splenic granulomas.  Probable hypermetabolic node in left upper quadrant posterior to pancreatic tail along splenic hilum 1.9 x 1.3 cm (SUV 7.1).  Enlarged pelvic lymph nodes intensely hypermetabolic, 4.0 x 3.2 cm left external  iliac lymph node (SUV 6.9) right inguinal lymph node 1.8 x 1.2 cm (SUV 6.1) tiny minute hypermetabolic left inguinal lymph node.     Jan 21 2023- Recommended to patient that she undergo U/S guided core needle bx and aspirate to evaluate adenopathy.  Material should be sent for usual pathologic studies and flow for lymphoma Jan 28 2023-  U/S guided bx of right inguinal lymph node. Pathology demonstrated metastatic poorly differentiated carcinoma. Immunohistochemistry favored to renal origin    Therapeutics May 14 20214- Discussed possible treatment options with patient 1) immunotherapy +/- targeted therapy 2) observation 3) hospice.  Goal of treatment is palliation.    Provided information about Keytruda and Axitinib.  She is struggling with the dx and wishes to discuss matters with her son before coming to a decision.  I offered a telephone visit with them as early as next week.  She will consider it  Feb 12 2023- Spent 45 minutes in discussion with patient and son regarding therapeutic options and goals of care.  They are particularly worried about potential side effects from treatment.  Explained that single agent immunotherapy would be a reasonable choice in this setting,  particularly given her comorbidities and frail elderly status.  They will consider this but seem to be leaning toward watchful waiting     Personal and family history of malignancy Jan 21 2023- Recommend consideration of genetics consult as it has the potential to benefit offspring and grand children   Cancer Staging  Kidney cancer, primary, with metastasis from kidney to other site [facility]) Staging form: Kidney, AJCC 8th Edition - Clinical stage from 02/04/2023: Stage IV (cT1, cN1, cM1) - Signed by Loni Muse, MD on 02/04/2023 Histopathologic type: Renal cell carcinoma, NOS Stage prefix: Initial diagnosis Histologic grade (G): G3 Histologic grading system: 4 grade system    No problem-specific Assessment & Plan  notes found for this encounter.    No orders of the defined types were placed in this encounter.   45  minutes was spent in patient care.  This included time spent preparing to see the patient (e.g., review of tests), obtaining and/or reviewing separately obtained history, counseling and educating the patient/family/caregiver, ordering medications, tests, or procedures; documenting clinical information in the electronic or other health record, independently interpreting results and communicating results to the patient/family/caregiver as well as coordination of care.       All questions were answered. The patient knows to call the clinic with any problems, questions or concerns.  This note was electronically signed.    Loni Muse, MD  02/12/2023 4:12 PM

## 2023-02-14 ENCOUNTER — Encounter: Payer: Self-pay | Admitting: Oncology

## 2023-02-18 ENCOUNTER — Other Ambulatory Visit: Payer: Medicare Other

## 2023-02-18 DIAGNOSIS — Z7189 Other specified counseling: Secondary | ICD-10-CM | POA: Insufficient documentation

## 2023-02-18 DIAGNOSIS — R54 Age-related physical debility: Secondary | ICD-10-CM | POA: Insufficient documentation

## 2023-03-11 NOTE — Progress Notes (Signed)
[facility] Cancer Initial Visit:  Patient Care Team: Hurshel Party, NP as PCP - General (Internal Medicine) Simoncic, Gay Filler, DMD (Dentistry)  CHIEF COMPLAINTS/PURPOSE OF CONSULTATION:  Oncology History  Kidney cancer, primary, with metastasis from kidney to other site [facility])  02/04/2023 Initial Diagnosis   Kidney cancer, primary, with metastasis from kidney to other site [facility])   02/04/2023 Cancer Staging   Staging form: Kidney, AJCC 8th Edition - Clinical stage from 02/04/2023: Stage IV (cT1, cN1, cM1) - Signed by Loni Muse, MD on 02/04/2023 Histopathologic type: Renal cell carcinoma, NOS Stage prefix: Initial diagnosis Histologic grade (G): G3 Histologic grading system: 4 grade system     HISTORY OF PRESENTING ILLNESS: Rebekah Hunter 82 y.o. female is here because of  renal cell cancer  Medical history notable for paroxysmal atrial fibrillation, ischemic heart disease, macular degeneration, hypertension, remote history of EtOH abuse, COPD, GERD   December 02, 2022: Presented to PCP with mass in right groin which she noted 3 days prior.     WBC 7.9 hemoglobin 13.1 MCV 86 platelet count 201; 54 segs 32 lymphs 10 monos 3 eos 1 basophil   December 17 2022:  CT pelvis new enlarged iliac chain lymph node anterior to the left ovary measuring 3.8 x 2.7 cm.  Shotty left inguinal lymph nodes largest 1.3 x 0.7 cm.  Right inguinal chain node 1.4 x 1.7 cm   Jan 15, 2023:  PET/CT obtained to evaluate inguinal adenopathy.  Mild pulmonary scarring with central airway thickening.  Atherosclerosis of great vessels and coronary artery disease.  Small calcified granulomas in calcified mediastinal and left hilar lymph nodes multiple calcified splenic granulomas.  Probable hypermetabolic node in left upper quadrant posterior to pancreatic tail along splenic hilum 1.9 x 1.3 cm (SUV 7.1).  Enlarged pelvic lymph nodes intensely hypermetabolic, 4.0 x 3.2 cm left external iliac lymph node (SUV  6.9) right inguinal lymph node 1.8 x 1.2 cm (SUV 6.1) tiny minute hypermetabolic left inguinal lymph node.  Indeterminate hypermetabolic lesion within spinal canal at T11 likely meningioma     Jan 21 2023:  [facility] Hematology Consult   Patient was diagnosed with cancer of left breast diagnosed age 58 for which she underwent bilateral mastectomy with reconstruction.  Was noted to have DCIS right breast.  Did not need chemotherapy or XRT nor did she receive tamoxifen.   Last colonoscopy was about 4 yrs ago.     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  Formerly used EtOH.  Smokes up to 1/2 ppd   [facility] Mother died 47 sarcoma Father died 71 CVA Sister alive 48 retinitis pigmentosa cancer in abdomen Sister alive 54 well Brother alive 65 melanoma on neck Brother alive 14 afib, mobility problems   Patient has 3 adult children Eldest (daughter) breast cancer at 97  Jan 28 2023:  U/S guided core biopsy of right inguinal lymph node.  Pathology demonstrated metastatic poorly differentiated carcinoma.  Immunohistochemistry favored to renal origin.   Feb 04, 2023: Scheduled follow-up regarding adenopathy.  Reviewed results of biopsy with patient.  Has chronic pain in lumbar region.  Assured patient that this does not appear to be related to her newly diagnosed malignancy.   Feb 12 2023:  Telephone visit with patient and son to discuss therapeutic options.  March 12 2023:  Scheduled follow up regarding metastatic kidney cancer.  Patient has discussed matters with her son.  She has decided against proceeding with  any form of antineoplastic therapy.  She has done her advanced directives, medical and legal.  In part she is basing her decisions on experiences her friend has had in treating metastatic breast cancer.  She would like to have a follow up PET/CT in several months to determine kinetics of her disease   Review of Systems  Constitutional:  Negative for appetite change, chills, fatigue,  fever and unexpected weight change.  HENT:   Negative for hearing loss, lump/mass, mouth sores, nosebleeds, sore throat, tinnitus, trouble swallowing and voice change.   Eyes:  Negative for eye problems and icterus.       Vision changes:  None  Respiratory:  Negative for chest tightness, cough, hemoptysis, shortness of breath and wheezing.        DOE on walking up hill  Cardiovascular:  Negative for chest pain, leg swelling and palpitations.       PND:  none Orthopnea:  none  Gastrointestinal:  Negative for abdominal pain, blood in stool, constipation, diarrhea, nausea and vomiting.       Occasional nausea  Endocrine: Negative for hot flashes.       Cold intolerance:  none Heat intolerance:  none  Genitourinary:  Negative for bladder incontinence, difficulty urinating, dysuria, frequency, hematuria and nocturia.   Musculoskeletal:  Negative for gait problem, myalgias, neck pain and neck stiffness.       Chronic arthralgias and back pain  Skin:  Negative for itching, rash and wound.  Neurological:  Negative for extremity weakness, gait problem, headaches, light-headedness, numbness, seizures and speech difficulty.       Occasional numbness right hand related to old orthopedic injury  Hematological:  Negative for adenopathy. Does not bruise/bleed easily.    MEDICAL HISTORY: Past Medical History:  Diagnosis Date   Alcoholism (HCC) 06/11/2016   Anxiety 09/13/2015   Last Assessment & Plan:  She is doing better with this as a whole she feels continue with her current dose of med   Arthritis 09/13/2015   Last Assessment & Plan:  Formatting of this note might be different from the original. She feels this is stable for her and if she is more mobile then she feels better as a whole and she is trying to be more with this   Basal cell carcinoma    Close exposure to COVID-19 virus 05/17/2020   Demand ischemia 10/20/2016   Ductal carcinoma in situ (DCIS) of breast    Essential hypertension  09/13/2015   Last Assessment & Plan:  This is stable for her at this time and will follow along   Essential tremor 09/13/2015   Last Assessment & Plan:  This is stable for her and will follow   Fecal incontinence 09/13/2015   Last Assessment & Plan:  Formatting of this note might be different from the original. This is improved for her with eating more fruits and vegetables and avoiding beef and rich red meds   High risk medications (not anticoagulants) long-term use 09/13/2015   Hyperlipidemia, unspecified 09/13/2015   Last Assessment & Plan:  Update her lipids for her fasting   Hypertensive heart disease without heart failure 10/20/2016   Malaise and fatigue 09/13/2015   Last Assessment & Plan:  Formatting of this note might be different from the original. Update her labs for her she is getting better with getting out more and being active   Persistent atrial fibrillation (HCC) 10/20/2016   No anticoag from fall risk.  CHADS2 vasc=4   Primary insomnia  09/13/2015   Last Assessment & Plan:  She feels this is stable for her at this time and will follow   Recurrent major depressive disorder (HCC) 09/13/2015   Last Assessment & Plan:  Psychiatrist has said we can follow her now and she is comfortable with this as well on her remeron and her ativan and will be taking this over in about month for refills for her she will let us know when she needs this   Substance induced mood disorder (HCC) 07/25/2016   Vitamin D deficiency 11/20/2015   Last Assessment & Plan:  Formatting of this note might be different from the original. She takes this orally will follow    SURGICAL HISTORY: Past Surgical History:  Procedure Laterality Date   BREAST ENHANCEMENT SURGERY     MASTECTOMY     TONSILLECTOMY      SOCIAL HISTORY: Social History   Socioeconomic History   Marital status: Divorced    Spouse name: Not on file   Number of children: Not on file   Years of education: Not on file   Highest  education level: Not on file  Occupational History   Not on file  Tobacco Use   Smoking status: Some Days    Types: Cigarettes   Smokeless tobacco: Never  Vaping Use   Vaping status: Never Used  Substance and Sexual Activity   Alcohol use: Not Currently   Drug use: Never   Sexual activity: Not on file  Other Topics Concern   Not on file  Social History Narrative   Not on file   Social Determinants of Health   Financial Resource Strain: Not on file  Food Insecurity: No Food Insecurity (01/21/2023)   Hunger Vital Sign    Worried About Running Out of Food in the Last Year: Never true    Ran Out of Food in the Last Year: Never true  Transportation Needs: Not on file  Physical Activity: Not on file  Stress: Not on file  Social Connections: Not on file  Intimate Partner Violence: Not At Risk (01/21/2023)   Humiliation, Afraid, Rape, and Kick questionnaire    Fear of Current or Ex-Partner: No    Emotionally Abused: No    Physically Abused: No    Sexually Abused: No    FAMILY HISTORY Family History  Problem Relation Age of Onset   Other Mother        Sarcoma   Stroke Father    Cancer Sister    Multiple myeloma Sister    Endocrine tumor Sister    Other Sister        Wagnor's   Melanoma Brother    Breast cancer Daughter     ALLERGIES:  is allergic to iodine, aspirin, clonidine derivatives, metoprolol, diltiazem, and latex.  MEDICATIONS:  Current Outpatient Medications  Medication Sig Dispense Refill   acetaminophen (TYLENOL) 500 MG tablet Take 500 mg by mouth every 6 (six) hours as needed for mild pain.     amLODipine (NORVASC) 5 MG tablet Take 5 mg by mouth daily. Takes 0.5 tablet (2.5 mg) daily     Cholecalciferol (VITAMIN D3) 3000 units TABS Take 1 tablet by mouth daily.     LORazepam (ATIVAN) 0.5 MG tablet Take 0.5 tablets by mouth every 12 (twelve) hours as needed for anxiety.     losartan (COZAAR) 100 MG tablet Take 100 mg by mouth at bedtime.     nitroGLYCERIN  (NITROSTAT) 0.4 MG SL tablet Place 1 tablet (0.4  mg total) under the tongue every 5 (five) minutes as needed for chest pain. 25 tablet 11   omeprazole (PRILOSEC) 20 MG capsule Take 1 capsule by mouth daily as needed (acid reflux).  3   pravastatin (PRAVACHOL) 40 MG tablet Take 1 tablet by mouth daily.     mirtazapine (REMERON) 15 MG tablet Take 1 tablet by mouth daily.     No current facility-administered medications for this visit.    PHYSICAL EXAMINATION:  ECOG PERFORMANCE STATUS: 1 - Symptomatic but completely ambulatory   Vitals:   03/12/23 1529  BP: (!) 168/73  Pulse: 90  Resp: 12  Temp: 98.1 F (36.7 C)  SpO2: 94%    Filed Weights   03/12/23 1529  Weight: 110 lb 14.4 oz (50.3 kg)     Physical Exam Vitals and nursing note reviewed.  Constitutional:      General: She is not in acute distress.    Appearance: Normal appearance. She is not toxic-appearing or diaphoretic.     Comments: Here alone.  Thin  HENT:     Head: Normocephalic and atraumatic.     Right Ear: External ear normal.     Left Ear: External ear normal.     Nose: Nose normal. No congestion or rhinorrhea.  Eyes:     General: No scleral icterus.    Extraocular Movements: Extraocular movements intact.     Conjunctiva/sclera: Conjunctivae normal.     Pupils: Pupils are equal, round, and reactive to light.  Cardiovascular:     Rate and Rhythm: Normal rate.     Heart sounds: No murmur heard.    No friction rub. No gallop.  Pulmonary:     Effort: Pulmonary effort is normal. No respiratory distress.  Abdominal:     General: Bowel sounds are normal.     Palpations: Abdomen is soft.     Tenderness: There is no abdominal tenderness. There is no guarding or rebound.  Musculoskeletal:        General: No swelling, tenderness or deformity.     Cervical back: Normal range of motion and neck supple. No rigidity or tenderness.  Lymphadenopathy:     Head:     Right side of head: No submental, submandibular,  tonsillar, preauricular, posterior auricular or occipital adenopathy.     Left side of head: No submental, submandibular, tonsillar, preauricular, posterior auricular or occipital adenopathy.     Cervical: No cervical adenopathy.     Right cervical: No superficial, deep or posterior cervical adenopathy.    Left cervical: No superficial, deep or posterior cervical adenopathy.     Upper Body:     Right upper body: No supraclavicular, axillary, pectoral or epitrochlear adenopathy.     Left upper body: No supraclavicular, axillary, pectoral or epitrochlear adenopathy.  Skin:    General: Skin is warm.     Coloration: Skin is not jaundiced.     Findings: No lesion or rash.  Neurological:     General: No focal deficit present.     Mental Status: She is alert and oriented to person, place, and time.     Cranial Nerves: No cranial nerve deficit.  Psychiatric:        Mood and Affect: Mood normal.        Behavior: Behavior normal.        Thought Content: Thought content normal.        Judgment: Judgment normal.    As part of a telephone visit no physical exam could be  conducted  LABORATORY DATA: I have personally reviewed the data as listed:  No visits with results within 1 Month(s) from this visit.  Latest known visit with results is:  Admission on 08/14/2021, Discharged on 08/14/2021  Component Date Value Ref Range Status   WBC 08/14/2021 15.8 (H)  4.0 - 10.5 K/uL Final   RBC 08/14/2021 4.29  3.87 - 5.11 MIL/uL Final   Hemoglobin 08/14/2021 12.6  12.0 - 15.0 g/dL Final   HCT 40/98/1191 38.7  36.0 - 46.0 % Final   MCV 08/14/2021 90.2  80.0 - 100.0 fL Final   MCH 08/14/2021 29.4  26.0 - 34.0 pg Final   MCHC 08/14/2021 32.6  30.0 - 36.0 g/dL Final   RDW 47/82/9562 13.0  11.5 - 15.5 % Final   Platelets 08/14/2021 199  150 - 400 K/uL Final   nRBC 08/14/2021 0.0  0.0 - 0.2 % Final   Performed at [facility], [address] [place], [place] [address]   Sodium 08/14/2021 137  135 -  145 mmol/L Final   Potassium 08/14/2021 4.0  3.5 - 5.1 mmol/L Final   Chloride 08/14/2021 104  98 - 111 mmol/L Final   CO2 08/14/2021 24  22 - 32 mmol/L Final   Glucose, Bld 08/14/2021 122 (H)  70 - 99 mg/dL Final   Glucose reference range applies only to samples taken after fasting for at least 8 hours.   BUN 08/14/2021 7 (L)  8 - 23 mg/dL Final   Creatinine, Ser 08/14/2021 0.82  0.44 - 1.00 mg/dL Final   Calcium 57/84/6962 9.2  8.9 - 10.3 mg/dL Final   GFR, Estimated 08/14/2021 >60  >60 mL/min Final   Comment: (NOTE) Calculated using the CKD-EPI Creatinine Equation (2021)    Anion gap 08/14/2021 9  5 - 15 Final   Performed at [facility], [address], [place], [place] [address]   Color, Urine 08/14/2021 YELLOW  YELLOW Final   APPearance 08/14/2021 CLEAR  CLEAR Final   Specific Gravity, Urine 08/14/2021 1.025  1.005 - 1.030 Final   pH 08/14/2021 5.5  5.0 - 8.0 Final   Glucose, UA 08/14/2021 NEGATIVE  NEGATIVE mg/dL Final   Hgb urine dipstick 08/14/2021 NEGATIVE  NEGATIVE Final   Bilirubin Urine 08/14/2021 NEGATIVE  NEGATIVE Final   Ketones, ur 08/14/2021 NEGATIVE  NEGATIVE mg/dL Final   Protein, ur 13/24/4010 NEGATIVE  NEGATIVE mg/dL Final   Nitrite 27/25/3664 NEGATIVE  NEGATIVE Final   Leukocytes,Ua 08/14/2021 NEGATIVE  NEGATIVE Final   Microscopic not done on urines with negative protein, blood, leukocytes, nitrite, or glucose < 500 mg/dL.   RBC / HPF 08/14/2021 0-5  0 - 5 RBC/hpf Final   WBC, UA 08/14/2021 0-5  0 - 5 WBC/hpf Final   Bacteria, UA 08/14/2021 RARE (A)  NONE SEEN Final   Squamous Epithelial / HPF 08/14/2021 0-5  0 - 5 Final   Mucus 08/14/2021 PRESENT   Final   Hyaline Casts, UA 08/14/2021 PRESENT   Final   Performed at [facility], [address], [place], [place] [address]    RADIOGRAPHIC STUDIES: I have personally reviewed the radiological images as listed and agree with the findings in the report  No results found.  ASSESSMENT/PLAN   82  y.o. female is here because of  inguinal adenopathy.  Medical history notable for paroxysmal atrial fibrillation, ischemic heart disease, macular degeneration, hypertension, remote history of EtOH abuse, COPD, GERD   Renal cell carcinoma, Left  Kidney, Stage IV (T1 N1 M1) High grade :   December 17 2022 - CT pelvis new enlarged iliac chain lymph node anterior to left ovary 3.8 x 2.7 cm.  Shotty left inguinal lymph nodes largest 1.3 x 0.7 cm.  Right  Inguinal chain node 1.4 x 1.7 cm   Jan 15, 2023-  PET/CT.  Small calcified granulomas in calcified mediastinal and left hilar lymph nodes multiple calcified splenic granulomas.  Probable hypermetabolic node in left upper quadrant posterior to pancreatic tail along splenic hilum 1.9 x 1.3 cm (SUV 7.1).  Enlarged pelvic lymph nodes intensely hypermetabolic, 4.0 x 3.2 cm left external iliac lymph node (SUV 6.9) right inguinal lymph node 1.8 x 1.2 cm (SUV 6.1) tiny minute hypermetabolic left inguinal lymph node.     Jan 21 2023- Recommended to patient that she undergo U/S guided core needle bx and aspirate to evaluate adenopathy.  Material should be sent for usual pathologic studies and flow for lymphoma Jan 28 2023-  U/S guided bx of right inguinal lymph node. Pathology demonstrated metastatic poorly differentiated carcinoma. Immunohistochemistry favored to renal origin    Therapeutics May 14 20214- Discussed possible treatment options with patient 1) immunotherapy +/- targeted therapy 2) observation 3) hospice.  Goal of treatment is palliation.    Provided information about Keytruda and Axitinib.  She is struggling with the dx and wishes to discuss matters with her son before coming to a decision.  I offered a telephone visit with them as early as next week.  She will consider it  Feb 12 2023- Spent 45 minutes in discussion with patient and son regarding therapeutic options and goals of care.  They are particularly worried about potential side effects from  treatment.  Explained that single agent immunotherapy would be a reasonable choice in this setting,  particularly given her comorbidities and frail elderly status.  They will consider this but seem to be leaning toward watchful waiting  March 12 2023- Patient has decided against proceeding with any form of antineoplastic therapy.  She would like to have a follow up PET/CT in several months to determine kinetics of her disease which I have ordered.    Advanced directives:   March 12 2023- At today's visit we discussed advanced directives.  Patient is codifying her wants which is very appropriate given her situation (frail elderly, metastatic cancer)  This will hopefully prevent undergoing unnecessary suffering and caregivers will be relieved of decision-making burdens during moments of crisis or grief.  This is an important opportunity for patient to make choices regarding CPR, ICU admission, Mechanical ventilation, etc vs DNR/DNI and comfort measures.      Personal and family history of malignancy Jan 21 2023- Recommend consideration of genetics consult as it has the potential to benefit offspring and grand children    Cancer Staging  Kidney cancer, primary, with metastasis from kidney to other site [facility]) Staging form: Kidney, AJCC 8th Edition - Clinical stage from 02/04/2023: Stage IV (cT1, cN1, cM1) - Signed by Loni Muse, MD on 02/04/2023 Histopathologic type: Renal cell carcinoma, NOS Stage prefix: Initial diagnosis Histologic grade (G): G3 Histologic grading system: 4 grade system    No problem-specific Assessment & Plan notes found for this encounter.    Orders Placed This Encounter  Procedures   NM PET Image Initial (PI) Skull Base To Thigh    Standing Status:   Future    Standing Expiration Date:   03/11/2024    Order Specific Question:  If indicated for the ordered procedure, I authorize the administration of a radiopharmaceutical per Radiology protocol    Answer:   Yes     Order Specific Question:   Preferred imaging location?    Answer:   External    40  minutes was spent in patient care.  This included time spent preparing to see the patient (e.g., review of tests), obtaining and/or reviewing separately obtained history, counseling and educating the patient/family/caregiver, ordering medications, tests, or procedures; documenting clinical information in the electronic or other health record, independently interpreting results and communicating results to the patient/family/caregiver as well as coordination of care.       All questions were answered. The patient knows to call the clinic with any problems, questions or concerns.  This note was electronically signed.    Loni Muse, MD  03/31/2023 4:56 PM

## 2023-03-12 ENCOUNTER — Inpatient Hospital Stay: Payer: Medicare Other | Attending: Oncology | Admitting: Oncology

## 2023-03-12 ENCOUNTER — Inpatient Hospital Stay: Payer: Medicare Other

## 2023-03-12 VITALS — BP 168/73 | HR 90 | Temp 98.1°F | Resp 12 | Ht 63.6 in | Wt 110.9 lb

## 2023-03-12 DIAGNOSIS — R54 Age-related physical debility: Secondary | ICD-10-CM | POA: Diagnosis not present

## 2023-03-12 DIAGNOSIS — Z7189 Other specified counseling: Secondary | ICD-10-CM | POA: Diagnosis not present

## 2023-03-12 DIAGNOSIS — C649 Malignant neoplasm of unspecified kidney, except renal pelvis: Secondary | ICD-10-CM | POA: Diagnosis not present

## 2023-04-18 DIAGNOSIS — R590 Localized enlarged lymph nodes: Secondary | ICD-10-CM | POA: Diagnosis not present

## 2023-04-18 DIAGNOSIS — H9193 Unspecified hearing loss, bilateral: Secondary | ICD-10-CM | POA: Diagnosis not present

## 2023-04-18 DIAGNOSIS — C649 Malignant neoplasm of unspecified kidney, except renal pelvis: Secondary | ICD-10-CM | POA: Diagnosis not present

## 2023-04-18 DIAGNOSIS — M810 Age-related osteoporosis without current pathological fracture: Secondary | ICD-10-CM | POA: Diagnosis not present

## 2023-04-18 DIAGNOSIS — E785 Hyperlipidemia, unspecified: Secondary | ICD-10-CM | POA: Diagnosis not present

## 2023-04-18 DIAGNOSIS — I480 Paroxysmal atrial fibrillation: Secondary | ICD-10-CM | POA: Diagnosis not present

## 2023-04-18 DIAGNOSIS — J449 Chronic obstructive pulmonary disease, unspecified: Secondary | ICD-10-CM | POA: Diagnosis not present

## 2023-04-18 DIAGNOSIS — I10 Essential (primary) hypertension: Secondary | ICD-10-CM | POA: Diagnosis not present

## 2023-05-09 ENCOUNTER — Telehealth: Payer: Self-pay | Admitting: Oncology

## 2023-05-09 NOTE — Telephone Encounter (Signed)
05/09/23 Spoke with patient about upcoming PET Scan.Patient refused scan but will keep appt with Dr Angelene Giovanni.

## 2023-05-13 DIAGNOSIS — L814 Other melanin hyperpigmentation: Secondary | ICD-10-CM | POA: Diagnosis not present

## 2023-05-13 DIAGNOSIS — D225 Melanocytic nevi of trunk: Secondary | ICD-10-CM | POA: Diagnosis not present

## 2023-05-13 DIAGNOSIS — L821 Other seborrheic keratosis: Secondary | ICD-10-CM | POA: Diagnosis not present

## 2023-05-13 DIAGNOSIS — D2239 Melanocytic nevi of other parts of face: Secondary | ICD-10-CM | POA: Diagnosis not present

## 2023-05-20 ENCOUNTER — Encounter: Payer: Self-pay | Admitting: Oncology

## 2023-05-20 ENCOUNTER — Inpatient Hospital Stay: Payer: Medicare Other

## 2023-05-20 ENCOUNTER — Inpatient Hospital Stay: Payer: Medicare Other | Attending: Oncology | Admitting: Oncology

## 2023-05-20 VITALS — BP 193/83 | HR 69 | Temp 97.6°F | Resp 16 | Ht 63.6 in | Wt 111.8 lb

## 2023-05-20 DIAGNOSIS — C649 Malignant neoplasm of unspecified kidney, except renal pelvis: Secondary | ICD-10-CM

## 2023-05-20 NOTE — Progress Notes (Signed)
[facility] Cancer Initial Visit:  Patient Care Team: Hurshel Party, NP as PCP - General (Internal Medicine) Simoncic, Gay Filler, DMD (Dentistry)  CHIEF COMPLAINTS/PURPOSE OF CONSULTATION:  Oncology History  Kidney cancer, primary, with metastasis from kidney to other site [facility])  02/04/2023 Initial Diagnosis   Kidney cancer, primary, with metastasis from kidney to other site [facility])   02/04/2023 Cancer Staging   Staging form: Kidney, AJCC 8th Edition - Clinical stage from 02/04/2023: Stage IV (cT1, cN1, cM1) - Signed by Loni Muse, MD on 02/04/2023 Histopathologic type: Renal cell carcinoma, NOS Stage prefix: Initial diagnosis Histologic grade (G): G3 Histologic grading system: 4 grade system     HISTORY OF PRESENTING ILLNESS: Rebekah Hunter 82 y.o. female is here because of  renal cell cancer  Medical history notable for paroxysmal atrial fibrillation, ischemic heart disease, macular degeneration, hypertension, remote history of EtOH abuse, COPD, GERD   December 02, 2022: Presented to PCP with mass in right groin which she noted 3 days prior.     WBC 7.9 hemoglobin 13.1 MCV 86 platelet count 201; 54 segs 32 lymphs 10 monos 3 eos 1 basophil   December 17 2022:  CT pelvis new enlarged iliac chain lymph node anterior to the left ovary measuring 3.8 x 2.7 cm.  Shotty left inguinal lymph nodes largest 1.3 x 0.7 cm.  Right inguinal chain node 1.4 x 1.7 cm   Jan 15, 2023:  PET/CT obtained to evaluate inguinal adenopathy.  Mild pulmonary scarring with central airway thickening.  Atherosclerosis of great vessels and coronary artery disease.  Small calcified granulomas in calcified mediastinal and left hilar lymph nodes multiple calcified splenic granulomas.  Probable hypermetabolic node in left upper quadrant posterior to pancreatic tail along splenic hilum 1.9 x 1.3 cm (SUV 7.1).  Enlarged pelvic lymph nodes intensely hypermetabolic, 4.0 x 3.2 cm left external iliac lymph node (SUV  6.9) right inguinal lymph node 1.8 x 1.2 cm (SUV 6.1) tiny minute hypermetabolic left inguinal lymph node.  Indeterminate hypermetabolic lesion within spinal canal at T11 likely meningioma     Jan 21 2023:  [facility] Hematology Consult   Patient was diagnosed with cancer of left breast diagnosed age 34 for which she underwent bilateral mastectomy with reconstruction.  Was noted to have DCIS right breast.  Did not need chemotherapy or XRT nor did she receive tamoxifen.   Last colonoscopy was about 4 yrs ago.     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  Formerly used EtOH.  Smokes up to 1/2 ppd   [facility] Mother died 100 sarcoma Father died 96 CVA Sister alive 69 retinitis pigmentosa cancer in abdomen Sister alive 105 well Brother alive 16 melanoma on neck Brother alive 39 afib, mobility problems   Patient has 3 adult children Eldest (daughter) breast cancer at 5  Jan 28 2023:  U/S guided core biopsy of right inguinal lymph node.  Pathology demonstrated metastatic poorly differentiated carcinoma.  Immunohistochemistry favored to renal origin.   Feb 04, 2023: Scheduled follow-up regarding adenopathy.  Reviewed results of biopsy with patient.  Has chronic pain in lumbar region.  Assured patient that this does not appear to be related to her newly diagnosed malignancy.   Feb 12 2023:  Telephone visit with patient and son to discuss therapeutic options.  March 12 2023:    Patient has discussed matters with her son.  She has decided against proceeding with any form of antineoplastic therapy.  She has done her advanced directives, medical and legal.  In part she is basing her decisions on experiences her friend has had in treating metastatic breast cancer.  She would like to have a follow up PET/CT in several months to determine kinetics of her disease  May 20 2023:  Scheduled follow up regarding metastatic kidney cancer.  No weight loss; appetite variable.  Anxious.  No new aches or  pain.  No change in size of the right inguinal LN.  She thinks that a left inguinal LN may be enlarging.  Reports that daughter who had breast cancer was tested for BRCA1 and 2 and this was negative.  Discussed her diagnosis of kidney cancer and potential side effects of immune checkpoint inhibitors.     Review of Systems  Constitutional:  Negative for appetite change, chills, fatigue, fever and unexpected weight change.  HENT:   Negative for hearing loss, lump/mass, mouth sores, nosebleeds, sore throat, tinnitus, trouble swallowing and voice change.   Eyes:  Negative for eye problems and icterus.       Vision changes:  None  Respiratory:  Negative for chest tightness, cough, hemoptysis, shortness of breath and wheezing.        DOE on walking up hill  Cardiovascular:  Negative for chest pain, leg swelling and palpitations.       PND:  none Orthopnea:  none  Gastrointestinal:  Negative for abdominal pain, blood in stool, constipation, diarrhea, nausea and vomiting.       Occasional nausea  Endocrine: Negative for hot flashes.       Cold intolerance:  none Heat intolerance:  none  Genitourinary:  Negative for bladder incontinence, difficulty urinating, dysuria, frequency, hematuria and nocturia.   Musculoskeletal:  Negative for gait problem, myalgias, neck pain and neck stiffness.       Chronic arthralgias and back pain  Skin:  Negative for itching, rash and wound.  Neurological:  Negative for extremity weakness, gait problem, headaches, light-headedness, numbness, seizures and speech difficulty.       Occasional numbness right hand related to old orthopedic injury  Hematological:  Negative for adenopathy. Does not bruise/bleed easily.    MEDICAL HISTORY: Past Medical History:  Diagnosis Date   Alcoholism (HCC) 06/11/2016   Anxiety 09/13/2015   Last Assessment & Plan:  She is doing better with this as a whole she feels continue with her current dose of med   Arthritis 09/13/2015    Last Assessment & Plan:  Formatting of this note might be different from the original. She feels this is stable for her and if she is more mobile then she feels better as a whole and she is trying to be more with this   Basal cell carcinoma    Close exposure to COVID-19 virus 05/17/2020   Demand ischemia 10/20/2016   Ductal carcinoma in situ (DCIS) of breast    Essential hypertension 09/13/2015   Last Assessment & Plan:  This is stable for her at this time and will follow along   Essential tremor 09/13/2015   Last Assessment & Plan:  This is stable for her and will follow   Fecal incontinence 09/13/2015   Last Assessment & Plan:  Formatting of this note might be different from the original. This is improved for her with eating more fruits and vegetables and avoiding beef and rich red meds   High risk medications (not anticoagulants) long-term use 09/13/2015   Hyperlipidemia, unspecified 09/13/2015   Last Assessment & Plan:  Update her lipids for her fasting   Hypertensive heart disease without heart failure 10/20/2016   Malaise and fatigue 09/13/2015   Last Assessment & Plan:  Formatting of this note might be different from the original. Update her labs for her she is getting better with getting out more and being active   Persistent atrial fibrillation (HCC) 10/20/2016   No anticoag from fall risk.  CHADS2 vasc=4   Primary insomnia 09/13/2015   Last Assessment & Plan:  She feels this is stable for her at this time and will follow   Recurrent major depressive disorder (HCC) 09/13/2015   Last Assessment & Plan:  Psychiatrist has said we can follow her now and she is comfortable with this as well on her remeron and her ativan and will be taking this over in about month for refills for her she will let us know when she needs this   Substance induced mood disorder (HCC) 07/25/2016   Vitamin D deficiency 11/20/2015   Last Assessment & Plan:  Formatting of this note might be different from the  original. She takes this orally will follow    SURGICAL HISTORY: Past Surgical History:  Procedure Laterality Date   BREAST ENHANCEMENT SURGERY     MASTECTOMY     TONSILLECTOMY      SOCIAL HISTORY: Social History   Socioeconomic History   Marital status: Divorced    Spouse name: Not on file   Number of children: Not on file   Years of education: Not on file   Highest education level: Not on file  Occupational History   Not on file  Tobacco Use   Smoking status: Some Days    Types: Cigarettes   Smokeless tobacco: Never  Vaping Use   Vaping status: Never Used  Substance and Sexual Activity   Alcohol use: Not Currently   Drug use: Never   Sexual activity: Not on file  Other Topics Concern   Not on file  Social History Narrative   Not on file   Social Determinants of Health   Financial Resource Strain: Not on file  Food Insecurity: No Food Insecurity (01/21/2023)   Hunger Vital Sign    Worried About Running Out of Food in the Last Year: Never true    Ran Out of Food in the Last Year: Never true  Transportation Needs: Not on file  Physical Activity: Not on file  Stress: Not on file  Social Connections: Not on file  Intimate Partner Violence: Not At Risk (01/21/2023)   Humiliation, Afraid, Rape, and Kick questionnaire    Fear of Current or Ex-Partner: No    Emotionally Abused: No    Physically Abused: No    Sexually Abused: No    FAMILY HISTORY Family History  Problem Relation Age of Onset   Other Mother        Sarcoma   Stroke Father    Cancer Sister    Multiple myeloma Sister    Endocrine tumor Sister    Other Sister        Wagnor's   Melanoma Brother    Breast cancer Daughter     ALLERGIES:  is allergic to iodine, aspirin, clonidine derivatives, metoprolol, diltiazem, and latex.  MEDICATIONS:  Current Outpatient Medications  Medication Sig Dispense Refill   acetaminophen (TYLENOL) 500 MG tablet Take 500 mg by mouth every 6 (six) hours as needed for  mild pain.     amLODipine (NORVASC) 5 MG tablet Take 5 mg by mouth  daily. Takes 0.5 tablet (2.5 mg) daily     Cholecalciferol (VITAMIN D3) 3000 units TABS Take 1 tablet by mouth daily.     LORazepam (ATIVAN) 0.5 MG tablet Take 0.5 tablets by mouth every 12 (twelve) hours as needed for anxiety.     losartan (COZAAR) 100 MG tablet Take 100 mg by mouth at bedtime.     mirtazapine (REMERON) 15 MG tablet Take 1 tablet by mouth daily.     nitroGLYCERIN (NITROSTAT) 0.4 MG SL tablet Place 1 tablet (0.4 mg total) under the tongue every 5 (five) minutes as needed for chest pain. 25 tablet 11   omeprazole (PRILOSEC) 20 MG capsule Take 1 capsule by mouth daily as needed (acid reflux).  3   pravastatin (PRAVACHOL) 40 MG tablet Take 1 tablet by mouth daily.     No current facility-administered medications for this visit.    PHYSICAL EXAMINATION:  ECOG PERFORMANCE STATUS: 1 - Symptomatic but completely ambulatory   There were no vitals filed for this visit.   There were no vitals filed for this visit.    Physical Exam Vitals and nursing note reviewed.  Constitutional:      General: She is not in acute distress.    Appearance: Normal appearance. She is not toxic-appearing or diaphoretic.     Comments: Here alone.  Thin  HENT:     Head: Normocephalic and atraumatic.     Right Ear: External ear normal.     Left Ear: External ear normal.     Nose: Nose normal. No congestion or rhinorrhea.  Eyes:     General: No scleral icterus.    Extraocular Movements: Extraocular movements intact.     Conjunctiva/sclera: Conjunctivae normal.     Pupils: Pupils are equal, round, and reactive to light.  Cardiovascular:     Rate and Rhythm: Normal rate.     Heart sounds: No murmur heard.    No friction rub. No gallop.  Pulmonary:     Effort: Pulmonary effort is normal. No respiratory distress.  Abdominal:     General: Bowel sounds are normal.     Palpations: Abdomen is soft.     Tenderness: There is no  abdominal tenderness. There is no guarding or rebound.  Musculoskeletal:        General: No swelling, tenderness or deformity.     Cervical back: Normal range of motion and neck supple. No rigidity or tenderness.  Lymphadenopathy:     Head:     Right side of head: No submental, submandibular, tonsillar, preauricular, posterior auricular or occipital adenopathy.     Left side of head: No submental, submandibular, tonsillar, preauricular, posterior auricular or occipital adenopathy.     Cervical: No cervical adenopathy.     Right cervical: No superficial, deep or posterior cervical adenopathy.    Left cervical: No superficial, deep or posterior cervical adenopathy.     Upper Body:     Right upper body: No supraclavicular, axillary, pectoral or epitrochlear adenopathy.     Left upper body: No supraclavicular, axillary, pectoral or epitrochlear adenopathy.  Skin:    General: Skin is warm.     Coloration: Skin is not jaundiced.     Findings: No lesion or rash.  Neurological:     General: No focal deficit present.     Mental Status: She is alert and oriented to person, place, and time.     Cranial Nerves: No cranial nerve deficit.  Psychiatric:  Mood and Affect: Mood normal.        Behavior: Behavior normal.        Thought Content: Thought content normal.        Judgment: Judgment normal.   As part of a telephone visit no physical exam could be conducted  LABORATORY DATA: I have personally reviewed the data as listed:  No visits with results within 1 Month(s) from this visit.  Latest known visit with results is:  Admission on 08/14/2021, Discharged on 08/14/2021  Component Date Value Ref Range Status   WBC 08/14/2021 15.8 (H)  4.0 - 10.5 K/uL Final   RBC 08/14/2021 4.29  3.87 - 5.11 MIL/uL Final   Hemoglobin 08/14/2021 12.6  12.0 - 15.0 g/dL Final   HCT 16/06/9603 38.7  36.0 - 46.0 % Final   MCV 08/14/2021 90.2  80.0 - 100.0 fL Final   MCH 08/14/2021 29.4  26.0 - 34.0 pg  Final   MCHC 08/14/2021 32.6  30.0 - 36.0 g/dL Final   RDW 54/05/8118 13.0  11.5 - 15.5 % Final   Platelets 08/14/2021 199  150 - 400 K/uL Final   nRBC 08/14/2021 0.0  0.0 - 0.2 % Final   Performed at [facility], [address], [place], [place] [address]   Sodium 08/14/2021 137  135 - 145 mmol/L Final   Potassium 08/14/2021 4.0  3.5 - 5.1 mmol/L Final   Chloride 08/14/2021 104  98 - 111 mmol/L Final   CO2 08/14/2021 24  22 - 32 mmol/L Final   Glucose, Bld 08/14/2021 122 (H)  70 - 99 mg/dL Final   Glucose reference range applies only to samples taken after fasting for at least 8 hours.   BUN 08/14/2021 7 (L)  8 - 23 mg/dL Final   Creatinine, Ser 08/14/2021 0.82  0.44 - 1.00 mg/dL Final   Calcium 95/62/1308 9.2  8.9 - 10.3 mg/dL Final   GFR, Estimated 08/14/2021 >60  >60 mL/min Final   Comment: (NOTE) Calculated using the CKD-EPI Creatinine Equation (2021)    Anion gap 08/14/2021 9  5 - 15 Final   Performed at [facility], [address], [place], [place] [address]   Color, Urine 08/14/2021 YELLOW  YELLOW Final   APPearance 08/14/2021 CLEAR  CLEAR Final   Specific Gravity, Urine 08/14/2021 1.025  1.005 - 1.030 Final   pH 08/14/2021 5.5  5.0 - 8.0 Final   Glucose, UA 08/14/2021 NEGATIVE  NEGATIVE mg/dL Final   Hgb urine dipstick 08/14/2021 NEGATIVE  NEGATIVE Final   Bilirubin Urine 08/14/2021 NEGATIVE  NEGATIVE Final   Ketones, ur 08/14/2021 NEGATIVE  NEGATIVE mg/dL Final   Protein, ur 69/62/9528 NEGATIVE  NEGATIVE mg/dL Final   Nitrite 41/32/4401 NEGATIVE  NEGATIVE Final   Leukocytes,Ua 08/14/2021 NEGATIVE  NEGATIVE Final   Microscopic not done on urines with negative protein, blood, leukocytes, nitrite, or glucose < 500 mg/dL.   RBC / HPF 08/14/2021 0-5  0 - 5 RBC/hpf Final   WBC, UA 08/14/2021 0-5  0 - 5 WBC/hpf Final   Bacteria, UA 08/14/2021 RARE (A)  NONE SEEN Final   Squamous Epithelial / HPF 08/14/2021 0-5  0 - 5 Final   Mucus 08/14/2021 PRESENT   Final    Hyaline Casts, UA 08/14/2021 PRESENT   Final   Performed at [facility], [address], [place], [place] [address]    RADIOGRAPHIC STUDIES: I have personally reviewed the radiological images as listed and agree with  the findings in the report  No results found.  ASSESSMENT/PLAN   82 y.o. female is here because of  inguinal adenopathy.  Medical history notable for paroxysmal atrial fibrillation, ischemic heart disease, macular degeneration, hypertension, remote history of EtOH abuse, COPD, GERD   Renal cell carcinoma, Left Kidney, Stage IV (T1 N1 M1) High grade :   December 17 2022 - CT pelvis new enlarged iliac chain lymph node anterior to left ovary 3.8 x 2.7 cm.  Shotty left inguinal lymph nodes largest 1.3 x 0.7 cm.  Right  Inguinal chain node 1.4 x 1.7 cm   Jan 15, 2023-  PET/CT.  Small calcified granulomas in calcified mediastinal and left hilar lymph nodes multiple calcified splenic granulomas.  Probable hypermetabolic node in left upper quadrant posterior to pancreatic tail along splenic hilum 1.9 x 1.3 cm (SUV 7.1).  Enlarged pelvic lymph nodes intensely hypermetabolic, 4.0 x 3.2 cm left external iliac lymph node (SUV 6.9) right inguinal lymph node 1.8 x 1.2 cm (SUV 6.1) tiny minute hypermetabolic left inguinal lymph node.     Jan 21 2023- Recommended to patient that she undergo U/S guided core needle bx and aspirate to evaluate adenopathy.  Material should be sent for usual pathologic studies and flow for lymphoma Jan 28 2023-  U/S guided bx of right inguinal lymph node. Pathology demonstrated metastatic poorly differentiated carcinoma. Immunohistochemistry favored to renal origin    Therapeutics May 14 20214- Discussed possible treatment options with patient 1) immunotherapy +/- targeted therapy 2) observation 3) hospice.  Goal of treatment is palliation.    Provided information about Keytruda and Axitinib.  She is struggling with the dx and wishes to discuss matters with her son  before coming to a decision.  I offered a telephone visit with them as early as next week.  She will consider it  Feb 12 2023- Spent 45 minutes in discussion with patient and son regarding therapeutic options and goals of care.  They are particularly worried about potential side effects from treatment.  Explained that single agent immunotherapy would be a reasonable choice in this setting,  particularly given her comorbidities and frail elderly status.  They will consider this but seem to be leaning toward watchful waiting  March 12 2023- Patient has decided against proceeding with any form of antineoplastic therapy.  She would like to have a follow up PET/CT in several months to determine kinetics of her disease which I have ordered.    May 20 2023- Will arrange for PET/CT prior to return.  Once again discussed treatment options for metastatic kidney cancer focusing the discussion on immune checkpoint inhibitors  Advanced directives:   March 12 2023- At today's visit we discussed advanced directives.  Patient is codifying her wants which is very appropriate given her situation (frail elderly, metastatic cancer)  This will hopefully prevent undergoing unnecessary suffering and caregivers will be relieved of decision-making burdens during moments of crisis or grief.  This is an important opportunity for patient to make choices regarding CPR, ICU admission, Mechanical ventilation, etc vs DNR/DNI and comfort measures.      Personal and family history of malignancy Jan 21 2023- Recommend consideration of genetics consult as it has the potential to benefit offspring and grand children    Cancer Staging  Kidney cancer, primary, with metastasis from kidney to other site [facility]) Staging form: Kidney, AJCC 8th Edition - Clinical stage from 02/04/2023: Stage IV (cT1, cN1, cM1) - Signed by Loni Muse, MD on 02/04/2023  Histopathologic type: Renal cell carcinoma, NOS Stage prefix: Initial  diagnosis Histologic grade (G): G3 Histologic grading system: 4 grade system    No problem-specific Assessment & Plan notes found for this encounter.    No orders of the defined types were placed in this encounter.   30  minutes was spent in patient care.  This included time spent preparing to see the patient (e.g., review of tests), obtaining and/or reviewing separately obtained history, counseling and educating the patient/family/caregiver, ordering medications, tests, or procedures; documenting clinical information in the electronic or other health record, independently interpreting results and communicating results to the patient/family/caregiver as well as coordination of care.       All questions were answered. The patient knows to call the clinic with any problems, questions or concerns.  This note was electronically signed.    Loni Muse, MD  05/20/2023 8:41 AM

## 2023-05-21 DIAGNOSIS — Z0000 Encounter for general adult medical examination without abnormal findings: Secondary | ICD-10-CM | POA: Diagnosis not present

## 2023-05-21 DIAGNOSIS — Z9181 History of falling: Secondary | ICD-10-CM | POA: Diagnosis not present

## 2023-05-22 ENCOUNTER — Telehealth: Payer: Self-pay | Admitting: Oncology

## 2023-05-22 NOTE — Telephone Encounter (Signed)
Patient has been scheduled. Aware of appt date and time.   Message Received: 2 days ago Arville Care, CMA  P [facility] Scheduling Can schedule PET now at [place] only 1 month with labs

## 2023-06-04 DIAGNOSIS — C649 Malignant neoplasm of unspecified kidney, except renal pelvis: Secondary | ICD-10-CM | POA: Diagnosis not present

## 2023-06-04 DIAGNOSIS — Z853 Personal history of malignant neoplasm of breast: Secondary | ICD-10-CM | POA: Diagnosis not present

## 2023-06-04 DIAGNOSIS — R918 Other nonspecific abnormal finding of lung field: Secondary | ICD-10-CM | POA: Diagnosis not present

## 2023-06-04 DIAGNOSIS — I700 Atherosclerosis of aorta: Secondary | ICD-10-CM | POA: Diagnosis not present

## 2023-06-04 DIAGNOSIS — I2510 Atherosclerotic heart disease of native coronary artery without angina pectoris: Secondary | ICD-10-CM | POA: Diagnosis not present

## 2023-06-09 ENCOUNTER — Inpatient Hospital Stay: Payer: Medicare Other

## 2023-06-09 ENCOUNTER — Inpatient Hospital Stay: Payer: Medicare Other | Admitting: Oncology

## 2023-06-09 VITALS — BP 172/79 | HR 91 | Temp 98.1°F | Resp 14 | Ht 63.6 in | Wt 110.6 lb

## 2023-06-09 DIAGNOSIS — R54 Age-related physical debility: Secondary | ICD-10-CM | POA: Diagnosis not present

## 2023-06-09 DIAGNOSIS — R948 Abnormal results of function studies of other organs and systems: Secondary | ICD-10-CM

## 2023-06-09 DIAGNOSIS — C649 Malignant neoplasm of unspecified kidney, except renal pelvis: Secondary | ICD-10-CM

## 2023-06-09 NOTE — Progress Notes (Signed)
[facility] Cancer Initial Visit:  Patient Care Team: Hurshel Party, NP as PCP - General (Internal Medicine) Simoncic, Gay Filler, DMD (Dentistry)  CHIEF COMPLAINTS/PURPOSE OF CONSULTATION:  Oncology History  Kidney cancer, primary, with metastasis from kidney to other site [facility])  02/04/2023 Initial Diagnosis   Kidney cancer, primary, with metastasis from kidney to other site [facility])   02/04/2023 Cancer Staging   Staging form: Kidney, AJCC 8th Edition - Clinical stage from 02/04/2023: Stage IV (cT1, cN1, cM1) - Signed by Loni Muse, MD on 02/04/2023 Histopathologic type: Renal cell carcinoma, NOS Stage prefix: Initial diagnosis Histologic grade (G): G3 Histologic grading system: 4 grade system     HISTORY OF PRESENTING ILLNESS: Rebekah Hunter 82 y.o. female is here because of  renal cell cancer  Medical history notable for paroxysmal atrial fibrillation, ischemic heart disease, macular degeneration, hypertension, remote history of EtOH abuse, COPD, GERD   December 02, 2022: Presented to PCP with mass in right groin which she noted 3 days prior.     WBC 7.9 hemoglobin 13.1 MCV 86 platelet count 201; 54 segs 32 lymphs 10 monos 3 eos 1 basophil   December 17 2022:  CT pelvis new enlarged iliac chain lymph node anterior to the left ovary measuring 3.8 x 2.7 cm.  Shotty left inguinal lymph nodes largest 1.3 x 0.7 cm.  Right inguinal chain node 1.4 x 1.7 cm   Jan 15, 2023:  PET/CT obtained to evaluate inguinal adenopathy.  Mild pulmonary scarring with central airway thickening.  Atherosclerosis of great vessels and coronary artery disease.  Small calcified granulomas in calcified mediastinal and left hilar lymph nodes multiple calcified splenic granulomas.  Probable hypermetabolic node in left upper quadrant posterior to pancreatic tail along splenic hilum 1.9 x 1.3 cm (SUV 7.1).  Enlarged pelvic lymph nodes intensely hypermetabolic, 4.0 x 3.2 cm left external iliac lymph node (SUV  6.9) right inguinal lymph node 1.8 x 1.2 cm (SUV 6.1) tiny minute hypermetabolic left inguinal lymph node.  Indeterminate hypermetabolic lesion within spinal canal at T11 likely meningioma     Jan 21 2023:  [facility] Hematology Consult   Patient was diagnosed with cancer of left breast diagnosed age 24 for which she underwent bilateral mastectomy with reconstruction.  Was noted to have DCIS right breast.  Did not need chemotherapy or XRT nor did she receive tamoxifen.   Last colonoscopy was about 4 yrs ago.     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  Formerly used EtOH.  Smokes up to 1/2 ppd   [facility] Mother died 74 sarcoma Father died 32 CVA Sister alive 33 retinitis pigmentosa cancer in abdomen Sister alive 19 well Brother alive 19 melanoma on neck Brother alive 20 afib, mobility problems   Patient has 3 adult children Eldest (daughter) breast cancer at 40  Jan 28 2023:  U/S guided core biopsy of right inguinal lymph node.  Pathology demonstrated metastatic poorly differentiated carcinoma.  Immunohistochemistry favored to renal origin.   Feb 04, 2023: Scheduled follow-up regarding adenopathy.  Reviewed results of biopsy with patient.  Has chronic pain in lumbar region.  Assured patient that this does not appear to be related to her newly diagnosed malignancy.   Feb 12 2023:  Telephone visit with patient and son to discuss therapeutic options.  March 12 2023:    Patient has discussed matters with her son.  She has decided against proceeding with any form of antineoplastic therapy.  She has done her advanced directives, medical and legal.  In part she is basing her decisions on experiences her friend has had in treating metastatic breast cancer.  She would like to have a follow up PET/CT in several months to determine kinetics of her disease  May 20 2023:  No weight loss; appetite variable.  Anxious.  No new aches or pain.  No change in size of the right inguinal LN.  She  thinks that a left inguinal LN may be enlarging.  Reports that daughter who had breast cancer was tested for BRCA1 and 2 and this was negative.  Discussed her diagnosis of kidney cancer and potential side effects of immune checkpoint inhibitors.    June 04 2023:  PET/CT 1. Examination is very similar to the study from 01/15/2023, including apparent hypermetabolic nodal masses adjacent to the splenic hilum, along the  left pelvic sidewall and in the inguinal regions bilaterally.  Persistent hypermetabolism in the central spinal canal at the level of T11 where there is a small focus of calcification,  presumably a small meningioma.   June 09 2023:   Scheduled follow up regarding metastatic kidney cancer.  Reviewed results since last visit.  She thinks that she had a reaction to the FDG in the form of abdominal cramping without diarrhea, nausea which woke her up that night.  She had associated itching.  This was self limited.     Review of Systems  Constitutional:  Negative for appetite change, chills, fatigue, fever and unexpected weight change.  HENT:   Negative for hearing loss, lump/mass, mouth sores, nosebleeds, sore throat, tinnitus, trouble swallowing and voice change.   Eyes:  Negative for eye problems and icterus.       Vision changes:  None  Respiratory:  Negative for chest tightness, cough, hemoptysis, shortness of breath and wheezing.        DOE on walking up hill  Cardiovascular:  Negative for chest pain, leg swelling and palpitations.       PND:  none Orthopnea:  none  Gastrointestinal:  Negative for abdominal pain, blood in stool, constipation, diarrhea, nausea and vomiting.       Occasional nausea  Endocrine: Negative for hot flashes.       Cold intolerance:  none Heat intolerance:  none  Genitourinary:  Negative for bladder incontinence, difficulty urinating, dysuria, frequency, hematuria and nocturia.   Musculoskeletal:  Negative for gait problem, myalgias, neck pain  and neck stiffness.       Chronic arthralgias and back pain  Skin:  Negative for itching, rash and wound.  Neurological:  Negative for extremity weakness, gait problem, headaches, light-headedness, numbness, seizures and speech difficulty.       Occasional numbness right hand related to old orthopedic injury  Hematological:  Negative for adenopathy. Does not bruise/bleed easily.    MEDICAL HISTORY: Past Medical History:  Diagnosis Date   Alcoholism (HCC) 06/11/2016   Anxiety 09/13/2015   Last Assessment & Plan:  She is doing better with this as a whole she feels continue with her current dose of med   Arthritis 09/13/2015   Last Assessment & Plan:  Formatting of this note might be different from the original. She feels this is stable for her and if she is more mobile then she feels better as a whole and she is trying to be more with this   Atrial fibrillation (HCC)    Basal cell carcinoma    Close exposure to COVID-19 virus 05/17/2020  Demand ischemia 10/20/2016   Ductal carcinoma in situ (DCIS) of breast    Essential hypertension 09/13/2015   Last Assessment & Plan:  This is stable for her at this time and will follow along   Essential tremor 09/13/2015   Last Assessment & Plan:  This is stable for her and will follow   Fecal incontinence 09/13/2015   Last Assessment & Plan:  Formatting of this note might be different from the original. This is improved for her with eating more fruits and vegetables and avoiding beef and rich red meds   High risk medications (not anticoagulants) long-term use 09/13/2015   Hyperlipidemia, unspecified 09/13/2015   Last Assessment & Plan:  Update her lipids for her fasting   Hypertensive heart disease without heart failure 10/20/2016   Malaise and fatigue 09/13/2015   Last Assessment & Plan:  Formatting of this note might be different from the original. Update her labs for her she is getting better with getting out more and being active   Persistent  atrial fibrillation (HCC) 10/20/2016   No anticoag from fall risk.  CHADS2 vasc=4   Primary insomnia 09/13/2015   Last Assessment & Plan:  She feels this is stable for her at this time and will follow   Recurrent major depressive disorder (HCC) 09/13/2015   Last Assessment & Plan:  Psychiatrist has said we can follow her now and she is comfortable with this as well on her remeron and her ativan and will be taking this over in about month for refills for her she will let us know when she needs this   Substance induced mood disorder (HCC) 07/25/2016   Vitamin D deficiency 11/20/2015   Last Assessment & Plan:  Formatting of this note might be different from the original. She takes this orally will follow    SURGICAL HISTORY: Past Surgical History:  Procedure Laterality Date   BREAST ENHANCEMENT SURGERY     MASTECTOMY     TONSILLECTOMY      SOCIAL HISTORY: Social History   Socioeconomic History   Marital status: Divorced    Spouse name: Not on file   Number of children: Not on file   Years of education: Not on file   Highest education level: Not on file  Occupational History   Not on file  Tobacco Use   Smoking status: Some Days    Types: Cigarettes   Smokeless tobacco: Never  Vaping Use   Vaping status: Never Used  Substance and Sexual Activity   Alcohol use: Not Currently   Drug use: Never   Sexual activity: Not on file  Other Topics Concern   Not on file  Social History Narrative   Not on file   Social Determinants of Health   Financial Resource Strain: Not on file  Food Insecurity: No Food Insecurity (01/21/2023)   Hunger Vital Sign    Worried About Running Out of Food in the Last Year: Never true    Ran Out of Food in the Last Year: Never true  Transportation Needs: Not on file  Physical Activity: Not on file  Stress: Not on file  Social Connections: Not on file  Intimate Partner Violence: Not At Risk (01/21/2023)   Humiliation, Afraid, Rape, and Kick  questionnaire    Fear of Current or Ex-Partner: No    Emotionally Abused: No    Physically Abused: No    Sexually Abused: No    FAMILY HISTORY Family History  Problem Relation Age of Onset  Other Mother        Sarcoma   Stroke Father    Cancer Sister    Multiple myeloma Sister    Endocrine tumor Sister    Other Sister        Wagnor's   Melanoma Brother    Breast cancer Daughter     ALLERGIES:  is allergic to iodine, aspirin, clonidine derivatives, metoprolol, diltiazem, and latex.  MEDICATIONS:  Current Outpatient Medications  Medication Sig Dispense Refill   acetaminophen (TYLENOL) 500 MG tablet Take 500 mg by mouth every 6 (six) hours as needed for mild pain.     amLODipine (NORVASC) 5 MG tablet Take 5 mg by mouth daily. Takes 0.5 tablet (2.5 mg) daily     Cholecalciferol (VITAMIN D3) 3000 units TABS Take 1 tablet by mouth daily.     LORazepam (ATIVAN) 0.5 MG tablet Take 0.5 tablets by mouth every 12 (twelve) hours as needed for anxiety.     losartan (COZAAR) 100 MG tablet Take 100 mg by mouth at bedtime.     nitroGLYCERIN (NITROSTAT) 0.4 MG SL tablet Place 1 tablet (0.4 mg total) under the tongue every 5 (five) minutes as needed for chest pain. 25 tablet 11   omeprazole (PRILOSEC) 20 MG capsule Take 1 capsule by mouth daily as needed (acid reflux).  3   pravastatin (PRAVACHOL) 40 MG tablet Take 1 tablet by mouth daily.     mirtazapine (REMERON) 15 MG tablet Take 1 tablet by mouth daily.     No current facility-administered medications for this visit.    PHYSICAL EXAMINATION:  ECOG PERFORMANCE STATUS: 1 - Symptomatic but completely ambulatory   Vitals:   06/09/23 1105  BP: (!) 172/79  Pulse: 91  Resp: 14  Temp: 98.1 F (36.7 C)  SpO2: 98%     Filed Weights   06/09/23 1105  Weight: 110 lb 9.6 oz (50.2 kg)      Physical Exam Vitals and nursing note reviewed.  Constitutional:      General: She is not in acute distress.    Appearance: Normal  appearance. She is not toxic-appearing or diaphoretic.     Comments: Here alone.  Thin  HENT:     Head: Normocephalic and atraumatic.     Right Ear: External ear normal.     Left Ear: External ear normal.     Nose: Nose normal. No congestion or rhinorrhea.  Eyes:     General: No scleral icterus.    Extraocular Movements: Extraocular movements intact.     Conjunctiva/sclera: Conjunctivae normal.     Pupils: Pupils are equal, round, and reactive to light.  Cardiovascular:     Rate and Rhythm: Normal rate.     Heart sounds: No murmur heard.    No friction rub. No gallop.  Pulmonary:     Effort: Pulmonary effort is normal. No respiratory distress.  Abdominal:     General: Bowel sounds are normal.     Palpations: Abdomen is soft.     Tenderness: There is no abdominal tenderness. There is no guarding or rebound.  Musculoskeletal:        General: No swelling, tenderness or deformity.     Cervical back: Normal range of motion and neck supple. No rigidity or tenderness.  Lymphadenopathy:     Head:     Right side of head: No submental, submandibular, tonsillar, preauricular, posterior auricular or occipital adenopathy.     Left side of head: No submental, submandibular, tonsillar, preauricular, posterior auricular  or occipital adenopathy.     Cervical: No cervical adenopathy.     Right cervical: No superficial, deep or posterior cervical adenopathy.    Left cervical: No superficial, deep or posterior cervical adenopathy.     Upper Body:     Right upper body: No supraclavicular, axillary, pectoral or epitrochlear adenopathy.     Left upper body: No supraclavicular, axillary, pectoral or epitrochlear adenopathy.  Skin:    General: Skin is warm.     Coloration: Skin is not jaundiced.     Findings: No lesion or rash.  Neurological:     General: No focal deficit present.     Mental Status: She is alert and oriented to person, place, and time.     Cranial Nerves: No cranial nerve deficit.   Psychiatric:        Mood and Affect: Mood normal.        Behavior: Behavior normal.        Thought Content: Thought content normal.        Judgment: Judgment normal.    As part of a telephone visit no physical exam could be conducted  LABORATORY DATA: I have personally reviewed the data as listed:  No visits with results within 1 Month(s) from this visit.  Latest known visit with results is:  Admission on 08/14/2021, Discharged on 08/14/2021  Component Date Value Ref Range Status   WBC 08/14/2021 15.8 (H)  4.0 - 10.5 K/uL Final   RBC 08/14/2021 4.29  3.87 - 5.11 MIL/uL Final   Hemoglobin 08/14/2021 12.6  12.0 - 15.0 g/dL Final   HCT 40/34/7425 38.7  36.0 - 46.0 % Final   MCV 08/14/2021 90.2  80.0 - 100.0 fL Final   MCH 08/14/2021 29.4  26.0 - 34.0 pg Final   MCHC 08/14/2021 32.6  30.0 - 36.0 g/dL Final   RDW 95/63/8756 13.0  11.5 - 15.5 % Final   Platelets 08/14/2021 199  150 - 400 K/uL Final   nRBC 08/14/2021 0.0  0.0 - 0.2 % Final   Performed at [facility], [address] [place], [place] [address]   Sodium 08/14/2021 137  135 - 145 mmol/L Final   Potassium 08/14/2021 4.0  3.5 - 5.1 mmol/L Final   Chloride 08/14/2021 104  98 - 111 mmol/L Final   CO2 08/14/2021 24  22 - 32 mmol/L Final   Glucose, Bld 08/14/2021 122 (H)  70 - 99 mg/dL Final   Glucose reference range applies only to samples taken after fasting for at least 8 hours.   BUN 08/14/2021 7 (L)  8 - 23 mg/dL Final   Creatinine, Ser 08/14/2021 0.82  0.44 - 1.00 mg/dL Final   Calcium 51/88/4166 9.2  8.9 - 10.3 mg/dL Final   GFR, Estimated 08/14/2021 >60  >60 mL/min Final   Comment: (NOTE) Calculated using the CKD-EPI Creatinine Equation (2021)    Anion gap 08/14/2021 9  5 - 15 Final   Performed at [facility], [address], [place], [place] [address]   Color, Urine 08/14/2021 YELLOW  YELLOW Final   APPearance 08/14/2021 CLEAR  CLEAR Final   Specific Gravity, Urine 08/14/2021 1.025  1.005 - 1.030  Final   pH 08/14/2021 5.5  5.0 - 8.0 Final   Glucose, UA 08/14/2021 NEGATIVE  NEGATIVE mg/dL Final   Hgb urine dipstick 08/14/2021 NEGATIVE  NEGATIVE Final   Bilirubin Urine 08/14/2021 NEGATIVE  NEGATIVE Final   Ketones, ur 08/14/2021 NEGATIVE  NEGATIVE mg/dL  Final   Protein, ur 08/14/2021 NEGATIVE  NEGATIVE mg/dL Final   Nitrite 60/45/4098 NEGATIVE  NEGATIVE Final   Leukocytes,Ua 08/14/2021 NEGATIVE  NEGATIVE Final   Microscopic not done on urines with negative protein, blood, leukocytes, nitrite, or glucose < 500 mg/dL.   RBC / HPF 08/14/2021 0-5  0 - 5 RBC/hpf Final   WBC, UA 08/14/2021 0-5  0 - 5 WBC/hpf Final   Bacteria, UA 08/14/2021 RARE (A)  NONE SEEN Final   Squamous Epithelial / HPF 08/14/2021 0-5  0 - 5 Final   Mucus 08/14/2021 PRESENT   Final   Hyaline Casts, UA 08/14/2021 PRESENT   Final   Performed at [facility], [address] [place], [place] [address]    RADIOGRAPHIC STUDIES: I have personally reviewed the radiological images as listed and agree with the findings in the report  No results found.  ASSESSMENT/PLAN   82 y.o. female is here because of  inguinal adenopathy.  Medical history notable for paroxysmal atrial fibrillation, ischemic heart disease, macular degeneration, hypertension, remote history of EtOH abuse, COPD, GERD   Renal cell carcinoma, Left Kidney, Stage IV (T1 N1 M1) High grade :   December 17 2022 - CT pelvis new enlarged iliac chain lymph node anterior to left ovary 3.8 x 2.7 cm.  Shotty left inguinal lymph nodes largest 1.3 x 0.7 cm.  Right  Inguinal chain node 1.4 x 1.7 cm   Jan 15, 2023-  PET/CT.  Small calcified granulomas in calcified mediastinal and left hilar lymph nodes multiple calcified splenic granulomas.  Probable hypermetabolic node in left upper quadrant posterior to pancreatic tail along splenic hilum 1.9 x 1.3 cm (SUV 7.1).  Enlarged pelvic lymph nodes intensely hypermetabolic, 4.0 x 3.2 cm left external iliac lymph node (SUV  6.9) right inguinal lymph node 1.8 x 1.2 cm (SUV 6.1) tiny minute hypermetabolic left inguinal lymph node.     Jan 21 2023- Recommended to patient that she undergo U/S guided core needle bx and aspirate to evaluate adenopathy.  Material should be sent for usual pathologic studies and flow for lymphoma Jan 28 2023-  U/S guided bx of right inguinal lymph node. Pathology demonstrated metastatic poorly differentiated carcinoma. Immunohistochemistry favored to renal origin    Therapeutics May 14 20214- Discussed possible treatment options with patient 1) immunotherapy +/- targeted therapy 2) observation 3) hospice.  Goal of treatment is palliation.    Provided information about Keytruda and Axitinib.  She is struggling with the dx and wishes to discuss matters with her son before coming to a decision.  I offered a telephone visit with them as early as next week.  She will consider it  Feb 12 2023- Spent 45 minutes in discussion with patient and son regarding therapeutic options and goals of care.  They are particularly worried about potential side effects from treatment.  Explained that single agent immunotherapy would be a reasonable choice in this setting,  particularly given her comorbidities and frail elderly status.  They will consider this but seem to be leaning toward watchful waiting  March 12 2023- Patient has decided against proceeding with any form of antineoplastic therapy.  She would like to have a follow up PET/CT in several months to determine kinetics of her disease which I have ordered.    May 20 2023- Will arrange for PET/CT prior to return.  Once again discussed treatment options for metastatic kidney cancer focusing the discussion on immune checkpoint inhibitors  June 04 2023:  PET/CT--Examination is very similar to 01/15/2023, including apparent hypermetabolic nodal masses adjacent to the splenic hilum, along the  left pelvic sidewall and in the inguinal regions bilaterally.     June 08 3033-  Patient wishes to continue observation.  She is concerned about the possibility of side effects from any intervention offered.  Assured patient that rxn to FDG is rare and if it occurred appeared to be self limited   Advanced directives:   March 12 2023- At today's visit we discussed advanced directives.  Patient is codifying her wants which is very appropriate given her situation (frail elderly, metastatic cancer)  This will hopefully prevent undergoing unnecessary suffering and caregivers will be relieved of decision-making burdens during moments of crisis or grief.  This is an important opportunity for patient to make choices regarding CPR, ICU admission, Mechanical ventilation, etc vs DNR/DNI and comfort measures.      Personal and family history of malignancy Jan 21 2023- Recommend consideration of genetics consult as it has the potential to benefit offspring and grand children    Cancer Staging  Kidney cancer, primary, with metastasis from kidney to other site [facility]) Staging form: Kidney, AJCC 8th Edition - Clinical stage from 02/04/2023: Stage IV (cT1, cN1, cM1) - Signed by Loni Muse, MD on 02/04/2023 Histopathologic type: Renal cell carcinoma, NOS Stage prefix: Initial diagnosis Histologic grade (G): G3 Histologic grading system: 4 grade system    No problem-specific Assessment & Plan notes found for this encounter.    No orders of the defined types were placed in this encounter.   30  minutes was spent in patient care.  This included time spent preparing to see the patient (e.g., review of tests), obtaining and/or reviewing separately obtained history, counseling and educating the patient/family/caregiver, ordering medications, tests, or procedures; documenting clinical information in the electronic or other health record, independently interpreting results and communicating results to the patient/family/caregiver as well as coordination of care.        All questions were answered. The patient knows to call the clinic with any problems, questions or concerns.  This note was electronically signed.    Loni Muse, MD  06/09/2023 11:23 AM

## 2023-06-11 DIAGNOSIS — R519 Headache, unspecified: Secondary | ICD-10-CM | POA: Diagnosis not present

## 2023-06-17 DIAGNOSIS — I10 Essential (primary) hypertension: Secondary | ICD-10-CM | POA: Diagnosis not present

## 2023-06-17 DIAGNOSIS — Z6820 Body mass index (BMI) 20.0-20.9, adult: Secondary | ICD-10-CM | POA: Diagnosis not present

## 2023-06-17 DIAGNOSIS — Z23 Encounter for immunization: Secondary | ICD-10-CM | POA: Diagnosis not present

## 2023-06-17 DIAGNOSIS — C649 Malignant neoplasm of unspecified kidney, except renal pelvis: Secondary | ICD-10-CM | POA: Diagnosis not present

## 2023-06-30 DIAGNOSIS — R948 Abnormal results of function studies of other organs and systems: Secondary | ICD-10-CM | POA: Insufficient documentation

## 2023-07-02 ENCOUNTER — Encounter: Payer: Self-pay | Admitting: Oncology

## 2023-07-21 DIAGNOSIS — Z6820 Body mass index (BMI) 20.0-20.9, adult: Secondary | ICD-10-CM | POA: Diagnosis not present

## 2023-07-21 DIAGNOSIS — C649 Malignant neoplasm of unspecified kidney, except renal pelvis: Secondary | ICD-10-CM | POA: Diagnosis not present

## 2023-07-21 DIAGNOSIS — I10 Essential (primary) hypertension: Secondary | ICD-10-CM | POA: Diagnosis not present

## 2023-08-26 DIAGNOSIS — H52223 Regular astigmatism, bilateral: Secondary | ICD-10-CM | POA: Diagnosis not present

## 2023-08-26 DIAGNOSIS — H524 Presbyopia: Secondary | ICD-10-CM | POA: Diagnosis not present

## 2023-08-26 DIAGNOSIS — H35371 Puckering of macula, right eye: Secondary | ICD-10-CM | POA: Diagnosis not present

## 2023-08-26 DIAGNOSIS — Z9849 Cataract extraction status, unspecified eye: Secondary | ICD-10-CM | POA: Diagnosis not present

## 2023-08-26 DIAGNOSIS — H5203 Hypermetropia, bilateral: Secondary | ICD-10-CM | POA: Diagnosis not present

## 2023-09-08 ENCOUNTER — Ambulatory Visit: Payer: Medicare Other | Admitting: Oncology

## 2023-09-22 DIAGNOSIS — Z6821 Body mass index (BMI) 21.0-21.9, adult: Secondary | ICD-10-CM | POA: Diagnosis not present

## 2023-09-22 DIAGNOSIS — C649 Malignant neoplasm of unspecified kidney, except renal pelvis: Secondary | ICD-10-CM | POA: Diagnosis not present

## 2023-09-22 DIAGNOSIS — I480 Paroxysmal atrial fibrillation: Secondary | ICD-10-CM | POA: Diagnosis not present

## 2023-09-22 DIAGNOSIS — E785 Hyperlipidemia, unspecified: Secondary | ICD-10-CM | POA: Diagnosis not present

## 2023-09-22 DIAGNOSIS — H9193 Unspecified hearing loss, bilateral: Secondary | ICD-10-CM | POA: Diagnosis not present

## 2023-09-22 DIAGNOSIS — I10 Essential (primary) hypertension: Secondary | ICD-10-CM | POA: Diagnosis not present

## 2023-10-12 DIAGNOSIS — W1849XA Other slipping, tripping and stumbling without falling, initial encounter: Secondary | ICD-10-CM | POA: Diagnosis not present

## 2023-10-12 DIAGNOSIS — T07XXXA Unspecified multiple injuries, initial encounter: Secondary | ICD-10-CM | POA: Diagnosis not present

## 2023-10-12 DIAGNOSIS — Z743 Need for continuous supervision: Secondary | ICD-10-CM | POA: Diagnosis not present

## 2023-10-12 DIAGNOSIS — M7989 Other specified soft tissue disorders: Secondary | ICD-10-CM | POA: Diagnosis not present

## 2023-10-12 DIAGNOSIS — R609 Edema, unspecified: Secondary | ICD-10-CM | POA: Diagnosis not present

## 2023-10-12 DIAGNOSIS — S99911A Unspecified injury of right ankle, initial encounter: Secondary | ICD-10-CM | POA: Diagnosis not present

## 2023-10-12 DIAGNOSIS — R6889 Other general symptoms and signs: Secondary | ICD-10-CM | POA: Diagnosis not present

## 2023-10-12 DIAGNOSIS — S93401A Sprain of unspecified ligament of right ankle, initial encounter: Secondary | ICD-10-CM | POA: Diagnosis not present

## 2023-11-24 DIAGNOSIS — Z6821 Body mass index (BMI) 21.0-21.9, adult: Secondary | ICD-10-CM | POA: Diagnosis not present

## 2023-11-24 DIAGNOSIS — E785 Hyperlipidemia, unspecified: Secondary | ICD-10-CM | POA: Diagnosis not present

## 2023-11-24 DIAGNOSIS — Z720 Tobacco use: Secondary | ICD-10-CM | POA: Diagnosis not present

## 2023-11-24 DIAGNOSIS — I10 Essential (primary) hypertension: Secondary | ICD-10-CM | POA: Diagnosis not present

## 2023-11-24 DIAGNOSIS — I480 Paroxysmal atrial fibrillation: Secondary | ICD-10-CM | POA: Diagnosis not present

## 2023-11-24 DIAGNOSIS — C649 Malignant neoplasm of unspecified kidney, except renal pelvis: Secondary | ICD-10-CM | POA: Diagnosis not present

## 2023-11-24 DIAGNOSIS — H9193 Unspecified hearing loss, bilateral: Secondary | ICD-10-CM | POA: Diagnosis not present

## 2023-11-24 DIAGNOSIS — J449 Chronic obstructive pulmonary disease, unspecified: Secondary | ICD-10-CM | POA: Diagnosis not present

## 2024-01-27 DIAGNOSIS — C649 Malignant neoplasm of unspecified kidney, except renal pelvis: Secondary | ICD-10-CM | POA: Diagnosis not present

## 2024-01-27 DIAGNOSIS — Z6820 Body mass index (BMI) 20.0-20.9, adult: Secondary | ICD-10-CM | POA: Diagnosis not present

## 2024-01-27 DIAGNOSIS — I10 Essential (primary) hypertension: Secondary | ICD-10-CM | POA: Diagnosis not present

## 2024-02-16 DIAGNOSIS — L728 Other follicular cysts of the skin and subcutaneous tissue: Secondary | ICD-10-CM | POA: Diagnosis not present

## 2024-02-16 DIAGNOSIS — L820 Inflamed seborrheic keratosis: Secondary | ICD-10-CM | POA: Diagnosis not present

## 2024-02-16 DIAGNOSIS — L570 Actinic keratosis: Secondary | ICD-10-CM | POA: Diagnosis not present

## 2024-02-16 DIAGNOSIS — L578 Other skin changes due to chronic exposure to nonionizing radiation: Secondary | ICD-10-CM | POA: Diagnosis not present

## 2024-02-16 DIAGNOSIS — L700 Acne vulgaris: Secondary | ICD-10-CM | POA: Diagnosis not present

## 2024-04-12 DIAGNOSIS — I10 Essential (primary) hypertension: Secondary | ICD-10-CM | POA: Diagnosis not present

## 2024-04-12 DIAGNOSIS — M810 Age-related osteoporosis without current pathological fracture: Secondary | ICD-10-CM | POA: Diagnosis not present

## 2024-04-12 DIAGNOSIS — R252 Cramp and spasm: Secondary | ICD-10-CM | POA: Diagnosis not present

## 2024-04-12 DIAGNOSIS — E785 Hyperlipidemia, unspecified: Secondary | ICD-10-CM | POA: Diagnosis not present

## 2024-04-12 DIAGNOSIS — Z6821 Body mass index (BMI) 21.0-21.9, adult: Secondary | ICD-10-CM | POA: Diagnosis not present

## 2024-04-12 DIAGNOSIS — C649 Malignant neoplasm of unspecified kidney, except renal pelvis: Secondary | ICD-10-CM | POA: Diagnosis not present

## 2024-05-18 DIAGNOSIS — D225 Melanocytic nevi of trunk: Secondary | ICD-10-CM | POA: Diagnosis not present

## 2024-05-18 DIAGNOSIS — D2239 Melanocytic nevi of other parts of face: Secondary | ICD-10-CM | POA: Diagnosis not present

## 2024-05-18 DIAGNOSIS — L820 Inflamed seborrheic keratosis: Secondary | ICD-10-CM | POA: Diagnosis not present

## 2024-05-18 DIAGNOSIS — L814 Other melanin hyperpigmentation: Secondary | ICD-10-CM | POA: Diagnosis not present

## 2024-05-18 DIAGNOSIS — L821 Other seborrheic keratosis: Secondary | ICD-10-CM | POA: Diagnosis not present

## 2024-06-01 DIAGNOSIS — Z0000 Encounter for general adult medical examination without abnormal findings: Secondary | ICD-10-CM | POA: Diagnosis not present

## 2024-06-01 DIAGNOSIS — Z9181 History of falling: Secondary | ICD-10-CM | POA: Diagnosis not present

## 2024-06-15 DIAGNOSIS — H9201 Otalgia, right ear: Secondary | ICD-10-CM | POA: Diagnosis not present

## 2024-06-15 DIAGNOSIS — H6121 Impacted cerumen, right ear: Secondary | ICD-10-CM | POA: Diagnosis not present

## 2024-06-15 DIAGNOSIS — Z6820 Body mass index (BMI) 20.0-20.9, adult: Secondary | ICD-10-CM | POA: Diagnosis not present

## 2024-06-15 DIAGNOSIS — I10 Essential (primary) hypertension: Secondary | ICD-10-CM | POA: Diagnosis not present

## 2024-06-15 DIAGNOSIS — C649 Malignant neoplasm of unspecified kidney, except renal pelvis: Secondary | ICD-10-CM | POA: Diagnosis not present
